# Patient Record
Sex: Male | Born: 1935 | Race: White | Hispanic: No | Marital: Married | Smoking: Never smoker
Health system: Southern US, Community
[De-identification: ages and names within clinical notes are randomized; demographics above are authoritative.]

## PROBLEM LIST (undated history)

## (undated) DIAGNOSIS — I10 Essential (primary) hypertension: Secondary | ICD-10-CM

## (undated) DIAGNOSIS — I509 Heart failure, unspecified: Secondary | ICD-10-CM

## (undated) DIAGNOSIS — D649 Anemia, unspecified: Secondary | ICD-10-CM

## (undated) DIAGNOSIS — E119 Type 2 diabetes mellitus without complications: Secondary | ICD-10-CM

## (undated) DIAGNOSIS — J449 Chronic obstructive pulmonary disease, unspecified: Secondary | ICD-10-CM

## (undated) HISTORY — PX: CHOLECYSTECTOMY: SHX55

## (undated) HISTORY — PX: BACK SURGERY: SHX140

---

## 2003-12-23 ENCOUNTER — Other Ambulatory Visit: Payer: Self-pay

## 2003-12-24 ENCOUNTER — Other Ambulatory Visit: Payer: Self-pay

## 2003-12-25 ENCOUNTER — Other Ambulatory Visit: Payer: Self-pay

## 2004-04-08 ENCOUNTER — Other Ambulatory Visit: Payer: Self-pay

## 2005-12-01 ENCOUNTER — Emergency Department: Payer: Self-pay | Admitting: Emergency Medicine

## 2005-12-04 ENCOUNTER — Other Ambulatory Visit: Payer: Self-pay

## 2005-12-04 ENCOUNTER — Inpatient Hospital Stay: Payer: Self-pay | Admitting: Internal Medicine

## 2006-01-19 ENCOUNTER — Ambulatory Visit: Payer: Self-pay

## 2006-03-16 ENCOUNTER — Emergency Department: Payer: Self-pay | Admitting: Emergency Medicine

## 2006-03-16 ENCOUNTER — Other Ambulatory Visit: Payer: Self-pay

## 2006-04-02 ENCOUNTER — Ambulatory Visit: Payer: Self-pay | Admitting: Internal Medicine

## 2006-07-02 ENCOUNTER — Ambulatory Visit: Payer: Self-pay | Admitting: Unknown Physician Specialty

## 2008-01-28 ENCOUNTER — Ambulatory Visit: Payer: Self-pay | Admitting: Internal Medicine

## 2008-10-25 ENCOUNTER — Inpatient Hospital Stay: Payer: Self-pay | Admitting: Internal Medicine

## 2008-11-22 ENCOUNTER — Inpatient Hospital Stay: Payer: Self-pay | Admitting: Internal Medicine

## 2009-02-12 ENCOUNTER — Inpatient Hospital Stay: Payer: Self-pay | Admitting: Internal Medicine

## 2009-11-15 ENCOUNTER — Ambulatory Visit: Payer: Self-pay | Admitting: Internal Medicine

## 2009-12-11 ENCOUNTER — Ambulatory Visit: Payer: Self-pay | Admitting: Internal Medicine

## 2011-05-18 ENCOUNTER — Emergency Department: Payer: Self-pay | Admitting: Emergency Medicine

## 2011-07-25 ENCOUNTER — Ambulatory Visit: Payer: Self-pay | Admitting: Urology

## 2011-07-25 DIAGNOSIS — I2510 Atherosclerotic heart disease of native coronary artery without angina pectoris: Secondary | ICD-10-CM

## 2011-08-09 ENCOUNTER — Ambulatory Visit: Payer: Self-pay | Admitting: Urology

## 2011-08-11 LAB — PATHOLOGY REPORT

## 2012-01-12 ENCOUNTER — Ambulatory Visit: Payer: Self-pay | Admitting: Internal Medicine

## 2012-01-12 LAB — CBC CANCER CENTER
Basophil: 1 %
Comment - H1-Com3: NORMAL
HCT: 28.2 % — ABNORMAL LOW (ref 40.0–52.0)
HGB: 9 g/dL — ABNORMAL LOW (ref 13.0–18.0)
Lymphocytes: 19 %
MCH: 24.1 pg — ABNORMAL LOW (ref 26.0–34.0)
MCV: 75 fL — ABNORMAL LOW (ref 80–100)
Monocytes: 6 %
RDW: 19 % — ABNORMAL HIGH (ref 11.5–14.5)
WBC: 4.4 x10 3/mm (ref 3.8–10.6)

## 2012-01-12 LAB — RETICULOCYTES: Absolute Retic Count: 0.052 10*6/uL (ref 0.024–0.084)

## 2012-01-12 LAB — IRON AND TIBC
Iron Saturation: 7 %
Iron: 35 ug/dL — ABNORMAL LOW (ref 65–175)
Unbound Iron-Bind.Cap.: 454 ug/dL

## 2012-01-12 LAB — PROTIME-INR: Prothrombin Time: 13.7 secs (ref 11.5–14.7)

## 2012-01-12 LAB — FOLATE: Folic Acid: 48.5 ng/mL (ref 3.1–100.0)

## 2012-01-12 LAB — FIBRINOGEN: Fibrinogen: 353 mg/dL (ref 210–470)

## 2012-01-15 LAB — URINE IEP, RANDOM

## 2012-01-26 LAB — OCCULT BLOOD X 1 CARD TO LAB, STOOL
Occult Blood, Feces: NEGATIVE
Occult Blood, Feces: NEGATIVE

## 2012-02-09 ENCOUNTER — Ambulatory Visit: Payer: Self-pay | Admitting: Internal Medicine

## 2012-03-22 ENCOUNTER — Ambulatory Visit: Payer: Self-pay | Admitting: Internal Medicine

## 2012-03-22 LAB — CBC CANCER CENTER
Basophil #: 0 x10 3/mm (ref 0.0–0.1)
Basophil %: 0.4 %
HCT: 28 % — ABNORMAL LOW (ref 40.0–52.0)
HGB: 8.5 g/dL — ABNORMAL LOW (ref 13.0–18.0)
Lymphocyte %: 14.1 %
MCH: 22.9 pg — ABNORMAL LOW (ref 26.0–34.0)
MCHC: 30.4 g/dL — ABNORMAL LOW (ref 32.0–36.0)
MCV: 76 fL — ABNORMAL LOW (ref 80–100)
Monocyte #: 0.6 x10 3/mm (ref 0.2–1.0)
Monocyte %: 13.9 %
Neutrophil %: 70.5 %
RBC: 3.7 10*6/uL — ABNORMAL LOW (ref 4.40–5.90)
RDW: 19.4 % — ABNORMAL HIGH (ref 11.5–14.5)
WBC: 4.4 x10 3/mm (ref 3.8–10.6)

## 2012-03-22 LAB — IRON AND TIBC
Iron Saturation: 11 %
Unbound Iron-Bind.Cap.: 410 ug/dL

## 2012-03-22 LAB — FERRITIN: Ferritin (ARMC): 10 ng/mL (ref 8–388)

## 2012-04-10 ENCOUNTER — Ambulatory Visit: Payer: Self-pay | Admitting: Internal Medicine

## 2012-05-11 ENCOUNTER — Ambulatory Visit: Payer: Self-pay | Admitting: Internal Medicine

## 2012-05-15 ENCOUNTER — Encounter: Payer: Self-pay | Admitting: Internal Medicine

## 2012-06-10 ENCOUNTER — Encounter: Payer: Self-pay | Admitting: Internal Medicine

## 2012-06-25 ENCOUNTER — Ambulatory Visit: Payer: Self-pay | Admitting: Internal Medicine

## 2012-06-25 LAB — CBC CANCER CENTER
Basophil #: 0 x10 3/mm (ref 0.0–0.1)
Eosinophil #: 0.1 x10 3/mm (ref 0.0–0.7)
Eosinophil %: 1.3 %
HCT: 34.8 % — ABNORMAL LOW (ref 40.0–52.0)
Lymphocyte %: 15.3 %
MCHC: 31.3 g/dL — ABNORMAL LOW (ref 32.0–36.0)
Monocyte %: 10.9 %
Neutrophil #: 3.1 x10 3/mm (ref 1.4–6.5)
Neutrophil %: 72.1 %
Platelet: 83 x10 3/mm — ABNORMAL LOW (ref 150–440)
RDW: 20.4 % — ABNORMAL HIGH (ref 11.5–14.5)
WBC: 4.2 x10 3/mm (ref 3.8–10.6)

## 2012-06-25 LAB — IRON AND TIBC
Iron Saturation: 4 %
Iron: 15 ug/dL — ABNORMAL LOW (ref 65–175)
Unbound Iron-Bind.Cap.: 345 ug/dL

## 2012-06-25 LAB — FERRITIN: Ferritin (ARMC): 57 ng/mL (ref 8–388)

## 2012-07-11 ENCOUNTER — Ambulatory Visit: Payer: Self-pay | Admitting: Internal Medicine

## 2012-07-25 ENCOUNTER — Ambulatory Visit: Payer: Self-pay | Admitting: Unknown Physician Specialty

## 2012-09-18 ENCOUNTER — Ambulatory Visit: Payer: Self-pay | Admitting: Internal Medicine

## 2012-09-18 LAB — CBC CANCER CENTER
Basophil #: 0 x10 3/mm (ref 0.0–0.1)
Basophil %: 0.8 %
Eosinophil #: 0.1 x10 3/mm (ref 0.0–0.7)
Eosinophil %: 1.7 %
HCT: 34.6 % — ABNORMAL LOW (ref 40.0–52.0)
HGB: 10.9 g/dL — ABNORMAL LOW (ref 13.0–18.0)
Lymphocyte #: 0.7 x10 3/mm — ABNORMAL LOW (ref 1.0–3.6)
Lymphocyte %: 15.2 %
MCV: 91 fL (ref 80–100)
Monocyte #: 0.4 x10 3/mm (ref 0.2–1.0)
Platelet: 78 x10 3/mm — ABNORMAL LOW (ref 150–440)
RBC: 3.82 10*6/uL — ABNORMAL LOW (ref 4.40–5.90)
WBC: 4.3 x10 3/mm (ref 3.8–10.6)

## 2012-09-18 LAB — FERRITIN: Ferritin (ARMC): 46 ng/mL (ref 8–388)

## 2012-09-18 LAB — IRON AND TIBC
Iron Bind.Cap.(Total): 375 ug/dL (ref 250–450)
Unbound Iron-Bind.Cap.: 321 ug/dL

## 2012-09-24 LAB — CBC CANCER CENTER
Bands: 1 %
Eosinophil: 1 %
Lymphocytes: 18 %
MCH: 28.6 pg (ref 26.0–34.0)
MCV: 91 fL (ref 80–100)
Platelet: 84 x10 3/mm — ABNORMAL LOW (ref 150–440)
RDW: 16.9 % — ABNORMAL HIGH (ref 11.5–14.5)
WBC: 5.7 x10 3/mm (ref 3.8–10.6)

## 2012-09-24 LAB — RETICULOCYTES
Absolute Retic Count: 0.0697 10*6/uL (ref 0.031–0.129)
Reticulocyte: 1.76 % (ref 0.7–2.5)

## 2012-10-11 ENCOUNTER — Ambulatory Visit: Payer: Self-pay | Admitting: Internal Medicine

## 2012-12-10 ENCOUNTER — Ambulatory Visit: Payer: Self-pay | Admitting: Internal Medicine

## 2012-12-10 LAB — FERRITIN: Ferritin (ARMC): 46 ng/mL (ref 8–388)

## 2012-12-10 LAB — CBC CANCER CENTER
Basophil #: 0 x10 3/mm (ref 0.0–0.1)
Eosinophil #: 0.1 x10 3/mm (ref 0.0–0.7)
Eosinophil %: 1.7 %
HCT: 37.3 % — ABNORMAL LOW (ref 40.0–52.0)
HGB: 12.2 g/dL — ABNORMAL LOW (ref 13.0–18.0)
Lymphocyte #: 0.9 x10 3/mm — ABNORMAL LOW (ref 1.0–3.6)
Lymphocyte %: 15.5 %
MCH: 29 pg (ref 26.0–34.0)
Monocyte #: 0.6 x10 3/mm (ref 0.2–1.0)
Neutrophil #: 4 x10 3/mm (ref 1.4–6.5)
Neutrophil %: 71.4 %
RBC: 4.22 10*6/uL — ABNORMAL LOW (ref 4.40–5.90)
WBC: 5.6 x10 3/mm (ref 3.8–10.6)

## 2012-12-10 LAB — IRON AND TIBC
Iron Saturation: 15 %
Unbound Iron-Bind.Cap.: 321 ug/dL

## 2012-12-11 ENCOUNTER — Ambulatory Visit: Payer: Self-pay | Admitting: Internal Medicine

## 2013-03-03 ENCOUNTER — Ambulatory Visit: Payer: Self-pay | Admitting: Internal Medicine

## 2013-03-04 LAB — CBC CANCER CENTER
Basophil #: 0 x10 3/mm (ref 0.0–0.1)
Basophil %: 0.3 %
Eosinophil #: 0.1 x10 3/mm (ref 0.0–0.7)
Eosinophil %: 1.1 %
HCT: 34.1 % — ABNORMAL LOW (ref 40.0–52.0)
HGB: 11.3 g/dL — ABNORMAL LOW (ref 13.0–18.0)
Lymphocyte #: 0.6 x10 3/mm — ABNORMAL LOW (ref 1.0–3.6)
MCHC: 33.1 g/dL (ref 32.0–36.0)
MCV: 87 fL (ref 80–100)
Neutrophil #: 3.9 x10 3/mm (ref 1.4–6.5)
Neutrophil %: 77.8 %
Platelet: 79 x10 3/mm — ABNORMAL LOW (ref 150–440)
RBC: 3.93 10*6/uL — ABNORMAL LOW (ref 4.40–5.90)
RDW: 15.8 % — ABNORMAL HIGH (ref 11.5–14.5)

## 2013-03-04 LAB — IRON AND TIBC
Iron Bind.Cap.(Total): 389 ug/dL (ref 250–450)
Iron Saturation: 13 %
Iron: 52 ug/dL — ABNORMAL LOW (ref 65–175)
Unbound Iron-Bind.Cap.: 337 ug/dL

## 2013-03-04 LAB — FERRITIN: Ferritin (ARMC): 24 ng/mL (ref 8–388)

## 2013-03-11 ENCOUNTER — Ambulatory Visit: Payer: Self-pay | Admitting: Internal Medicine

## 2013-04-10 ENCOUNTER — Ambulatory Visit: Payer: Self-pay | Admitting: Internal Medicine

## 2013-05-23 ENCOUNTER — Ambulatory Visit: Payer: Self-pay | Admitting: Internal Medicine

## 2013-05-27 LAB — CBC CANCER CENTER
Basophil %: 0.5 %
Eosinophil #: 0.1 x10 3/mm (ref 0.0–0.7)
Eosinophil %: 1.2 %
HGB: 11.5 g/dL — ABNORMAL LOW (ref 13.0–18.0)
MCH: 29.5 pg (ref 26.0–34.0)
MCHC: 33.5 g/dL (ref 32.0–36.0)
MCV: 88 fL (ref 80–100)
Monocyte #: 0.4 x10 3/mm (ref 0.2–1.0)
Neutrophil #: 3.8 x10 3/mm (ref 1.4–6.5)
Neutrophil %: 74.5 %
Platelet: 85 x10 3/mm — ABNORMAL LOW (ref 150–440)
RDW: 16.7 % — ABNORMAL HIGH (ref 11.5–14.5)
WBC: 5 x10 3/mm (ref 3.8–10.6)

## 2013-06-10 ENCOUNTER — Ambulatory Visit: Payer: Self-pay | Admitting: Internal Medicine

## 2013-08-19 ENCOUNTER — Ambulatory Visit: Payer: Self-pay | Admitting: Internal Medicine

## 2013-08-19 LAB — IRON AND TIBC
Iron Bind.Cap.(Total): 412 ug/dL (ref 250–450)
Iron Saturation: 11 %
Iron: 45 ug/dL — ABNORMAL LOW (ref 65–175)
Unbound Iron-Bind.Cap.: 367 ug/dL

## 2013-08-19 LAB — CBC CANCER CENTER
Basophil #: 0 x10 3/mm (ref 0.0–0.1)
Basophil %: 0.3 %
Eosinophil #: 0.1 x10 3/mm (ref 0.0–0.7)
Eosinophil %: 1.2 %
HCT: 37 % — ABNORMAL LOW (ref 40.0–52.0)
HGB: 12.1 g/dL — ABNORMAL LOW (ref 13.0–18.0)
Lymphocyte %: 13.2 %
Monocyte #: 0.4 x10 3/mm (ref 0.2–1.0)
Neutrophil %: 77.7 %
Platelet: 91 x10 3/mm — ABNORMAL LOW (ref 150–440)
RBC: 4.24 10*6/uL — ABNORMAL LOW (ref 4.40–5.90)
RDW: 16.4 % — ABNORMAL HIGH (ref 11.5–14.5)

## 2013-08-19 LAB — FERRITIN: Ferritin (ARMC): 24 ng/mL (ref 8–388)

## 2013-09-10 ENCOUNTER — Ambulatory Visit: Payer: Self-pay | Admitting: Internal Medicine

## 2013-10-24 ENCOUNTER — Ambulatory Visit: Payer: Self-pay | Admitting: Unknown Physician Specialty

## 2013-10-27 LAB — PATHOLOGY REPORT

## 2013-11-11 ENCOUNTER — Ambulatory Visit: Payer: Self-pay | Admitting: Internal Medicine

## 2013-11-11 LAB — CBC CANCER CENTER
Basophil #: 0 x10 3/mm (ref 0.0–0.1)
Basophil %: 0.6 %
HCT: 36.4 % — ABNORMAL LOW (ref 40.0–52.0)
HGB: 11.7 g/dL — ABNORMAL LOW (ref 13.0–18.0)
Lymphocyte %: 13.6 %
MCH: 28.4 pg (ref 26.0–34.0)
MCV: 89 fL (ref 80–100)
Neutrophil #: 3.5 x10 3/mm (ref 1.4–6.5)
Neutrophil %: 75.8 %
RDW: 16.7 % — ABNORMAL HIGH (ref 11.5–14.5)
WBC: 4.6 x10 3/mm (ref 3.8–10.6)

## 2013-11-11 LAB — IRON AND TIBC
Iron Bind.Cap.(Total): 349 ug/dL (ref 250–450)
Unbound Iron-Bind.Cap.: 304 ug/dL

## 2013-12-11 ENCOUNTER — Ambulatory Visit: Payer: Self-pay | Admitting: Internal Medicine

## 2014-02-10 ENCOUNTER — Ambulatory Visit: Payer: Self-pay | Admitting: Internal Medicine

## 2014-02-10 LAB — CBC CANCER CENTER
BASOS ABS: 0 x10 3/mm (ref 0.0–0.1)
BASOS PCT: 0.6 %
EOS ABS: 0.1 x10 3/mm (ref 0.0–0.7)
Eosinophil %: 1.1 %
HCT: 36.2 % — ABNORMAL LOW (ref 40.0–52.0)
HGB: 11.5 g/dL — ABNORMAL LOW (ref 13.0–18.0)
LYMPHS ABS: 0.7 x10 3/mm — AB (ref 1.0–3.6)
Lymphocyte %: 14.8 %
MCH: 29.5 pg (ref 26.0–34.0)
MCHC: 31.9 g/dL — ABNORMAL LOW (ref 32.0–36.0)
MCV: 92 fL (ref 80–100)
MONO ABS: 0.4 x10 3/mm (ref 0.2–1.0)
Monocyte %: 9 %
NEUTROS PCT: 74.5 %
Neutrophil #: 3.5 x10 3/mm (ref 1.4–6.5)
Platelet: 82 x10 3/mm — ABNORMAL LOW (ref 150–440)
RBC: 3.92 10*6/uL — ABNORMAL LOW (ref 4.40–5.90)
RDW: 16.4 % — ABNORMAL HIGH (ref 11.5–14.5)
WBC: 4.7 x10 3/mm (ref 3.8–10.6)

## 2014-02-10 LAB — IRON AND TIBC
Iron Bind.Cap.(Total): 308 ug/dL (ref 250–450)
Iron Saturation: 13 %
Iron: 41 ug/dL — ABNORMAL LOW (ref 65–175)
Unbound Iron-Bind.Cap.: 267 ug/dL

## 2014-02-10 LAB — FERRITIN: Ferritin (ARMC): 130 ng/mL (ref 8–388)

## 2014-03-11 ENCOUNTER — Ambulatory Visit: Payer: Self-pay | Admitting: Internal Medicine

## 2014-05-05 ENCOUNTER — Observation Stay: Payer: Self-pay | Admitting: Internal Medicine

## 2014-05-05 LAB — CBC WITH DIFFERENTIAL/PLATELET
BASOS PCT: 0.4 %
BASOS PCT: 0.3 %
Basophil #: 0 10*3/uL (ref 0.0–0.1)
Basophil #: 0 10*3/uL (ref 0.0–0.1)
EOS ABS: 0 10*3/uL (ref 0.0–0.7)
Eosinophil #: 0 10*3/uL (ref 0.0–0.7)
Eosinophil %: 0.8 %
Eosinophil %: 0.8 %
HCT: 36.5 % — ABNORMAL LOW (ref 40.0–52.0)
HCT: 37.2 % — ABNORMAL LOW (ref 40.0–52.0)
HGB: 11.9 g/dL — ABNORMAL LOW (ref 13.0–18.0)
HGB: 12.1 g/dL — ABNORMAL LOW (ref 13.0–18.0)
LYMPHS ABS: 0.5 10*3/uL — AB (ref 1.0–3.6)
LYMPHS PCT: 9.6 %
Lymphocyte #: 0.5 10*3/uL — ABNORMAL LOW (ref 1.0–3.6)
Lymphocyte %: 12.8 %
MCH: 29.8 pg (ref 26.0–34.0)
MCH: 30 pg (ref 26.0–34.0)
MCHC: 32.5 g/dL (ref 32.0–36.0)
MCHC: 32.5 g/dL (ref 32.0–36.0)
MCV: 92 fL (ref 80–100)
MCV: 92 fL (ref 80–100)
MONO ABS: 0.4 x10 3/mm (ref 0.2–1.0)
MONO ABS: 0.4 x10 3/mm (ref 0.2–1.0)
MONOS PCT: 9.6 %
Monocyte %: 8.1 %
NEUTROS PCT: 81.1 %
Neutrophil #: 3 10*3/uL (ref 1.4–6.5)
Neutrophil #: 4.2 10*3/uL (ref 1.4–6.5)
Neutrophil %: 76.5 %
Platelet: 107 10*3/uL — ABNORMAL LOW (ref 150–440)
Platelet: 67 10*3/uL — ABNORMAL LOW (ref 150–440)
RBC: 3.99 10*6/uL — ABNORMAL LOW (ref 4.40–5.90)
RBC: 4.03 10*6/uL — ABNORMAL LOW (ref 4.40–5.90)
RDW: 14.6 % — ABNORMAL HIGH (ref 11.5–14.5)
RDW: 15.2 % — ABNORMAL HIGH (ref 11.5–14.5)
WBC: 5.2 10*3/uL (ref 3.8–10.6)
WBC: 4 10*3/uL (ref 3.8–10.6)

## 2014-05-05 LAB — BASIC METABOLIC PANEL
Anion Gap: 10 (ref 7–16)
BUN: 35 mg/dL — ABNORMAL HIGH (ref 7–18)
CO2: 21 mmol/L (ref 21–32)
Calcium, Total: 9.1 mg/dL (ref 8.5–10.1)
Chloride: 96 mmol/L — ABNORMAL LOW (ref 98–107)
Creatinine: 1.58 mg/dL — ABNORMAL HIGH (ref 0.60–1.30)
EGFR (African American): 47 — ABNORMAL LOW
EGFR (Non-African Amer.): 40 — ABNORMAL LOW
GLUCOSE: 156 mg/dL — AB (ref 65–99)
OSMOLALITY: 266 (ref 275–301)
Potassium: 5.1 mmol/L (ref 3.5–5.1)
SODIUM: 127 mmol/L — AB (ref 136–145)

## 2014-05-05 LAB — URINALYSIS, COMPLETE
BLOOD: NEGATIVE
Bacteria: NONE SEEN
Bilirubin,UR: NEGATIVE
Glucose,UR: NEGATIVE mg/dL (ref 0–75)
Ketone: NEGATIVE
LEUKOCYTE ESTERASE: NEGATIVE
Nitrite: NEGATIVE
Ph: 7 (ref 4.5–8.0)
Protein: 100
Specific Gravity: 1.011 (ref 1.003–1.030)
Squamous Epithelial: NONE SEEN
WBC UR: NONE SEEN /HPF (ref 0–5)

## 2014-05-05 LAB — CK-MB: CK-MB: 2.5 ng/mL (ref 0.5–3.6)

## 2014-05-05 LAB — TROPONIN I: Troponin-I: <0.02 ng/mL

## 2014-05-06 DIAGNOSIS — R079 Chest pain, unspecified: Secondary | ICD-10-CM

## 2014-05-06 LAB — LIPID PANEL
Cholesterol: 110 mg/dL (ref 0–200)
HDL Cholesterol: 43 mg/dL (ref 40–60)
LDL CHOLESTEROL, CALC: 50 mg/dL (ref 0–100)
Triglycerides: 87 mg/dL (ref 0–200)
VLDL CHOLESTEROL, CALC: 17 mg/dL (ref 5–40)

## 2014-05-06 LAB — BASIC METABOLIC PANEL
ANION GAP: 7 (ref 7–16)
BUN: 30 mg/dL — AB (ref 7–18)
CREATININE: 1.6 mg/dL — AB (ref 0.60–1.30)
Calcium, Total: 8.9 mg/dL (ref 8.5–10.1)
Chloride: 98 mmol/L (ref 98–107)
Co2: 28 mmol/L (ref 21–32)
GFR CALC AF AMER: 46 — AB
GFR CALC NON AF AMER: 40 — AB
Glucose: 112 mg/dL — ABNORMAL HIGH (ref 65–99)
OSMOLALITY: 273 (ref 275–301)
Potassium: 3.5 mmol/L (ref 3.5–5.1)
Sodium: 133 mmol/L — ABNORMAL LOW (ref 136–145)

## 2014-05-06 LAB — CBC WITH DIFFERENTIAL/PLATELET
BASOS ABS: 0 10*3/uL (ref 0.0–0.1)
BASOS PCT: 0.3 %
EOS ABS: 0.1 10*3/uL (ref 0.0–0.7)
Eosinophil %: 1.4 %
HCT: 37 % — ABNORMAL LOW (ref 40.0–52.0)
HGB: 12.1 g/dL — ABNORMAL LOW (ref 13.0–18.0)
Lymphocyte #: 0.7 10*3/uL — ABNORMAL LOW (ref 1.0–3.6)
Lymphocyte %: 18.9 %
MCH: 30.2 pg (ref 26.0–34.0)
MCHC: 32.8 g/dL (ref 32.0–36.0)
MCV: 92 fL (ref 80–100)
MONOS PCT: 10.4 %
Monocyte #: 0.4 x10 3/mm (ref 0.2–1.0)
Neutrophil #: 2.7 10*3/uL (ref 1.4–6.5)
Neutrophil %: 69 %
Platelet: 77 10*3/uL — ABNORMAL LOW (ref 150–440)
RBC: 4.02 10*6/uL — AB (ref 4.40–5.90)
RDW: 15 % — ABNORMAL HIGH (ref 11.5–14.5)
WBC: 3.9 10*3/uL (ref 3.8–10.6)

## 2014-05-06 LAB — CK-MB
CK-MB: 2.2 ng/mL (ref 0.5–3.6)
CK-MB: 2.2 ng/mL (ref 0.5–3.6)

## 2014-05-06 LAB — TROPONIN I
Troponin-I: <0.02 ng/mL
Troponin-I: <0.02 ng/mL

## 2014-05-13 ENCOUNTER — Ambulatory Visit: Payer: Self-pay | Admitting: Unknown Physician Specialty

## 2014-05-14 ENCOUNTER — Emergency Department: Payer: Self-pay | Admitting: Emergency Medicine

## 2014-05-14 LAB — CBC
HCT: 34.7 % — ABNORMAL LOW (ref 40.0–52.0)
HGB: 11.3 g/dL — ABNORMAL LOW (ref 13.0–18.0)
MCH: 30.4 pg (ref 26.0–34.0)
MCHC: 32.6 g/dL (ref 32.0–36.0)
MCV: 93 fL (ref 80–100)
PLATELETS: 73 10*3/uL — AB (ref 150–440)
RBC: 3.73 10*6/uL — ABNORMAL LOW (ref 4.40–5.90)
RDW: 15 % — ABNORMAL HIGH (ref 11.5–14.5)
WBC: 4.2 10*3/uL (ref 3.8–10.6)

## 2014-05-14 LAB — BASIC METABOLIC PANEL
Anion Gap: 4 — ABNORMAL LOW (ref 7–16)
BUN: 30 mg/dL — AB (ref 7–18)
CALCIUM: 8.6 mg/dL (ref 8.5–10.1)
CREATININE: 1.61 mg/dL — AB (ref 0.60–1.30)
Chloride: 105 mmol/L (ref 98–107)
Co2: 25 mmol/L (ref 21–32)
GFR CALC AF AMER: 45 — AB
GFR CALC NON AF AMER: 39 — AB
GLUCOSE: 79 mg/dL (ref 65–99)
OSMOLALITY: 273 (ref 275–301)
POTASSIUM: 4.1 mmol/L (ref 3.5–5.1)
Sodium: 134 mmol/L — ABNORMAL LOW (ref 136–145)

## 2014-05-14 LAB — TROPONIN I: Troponin-I: <0.02 ng/mL

## 2014-05-14 LAB — PRO B NATRIURETIC PEPTIDE: B-TYPE NATIURETIC PEPTID: 2049 pg/mL — AB (ref 0–450)

## 2014-10-07 LAB — CBC
HCT: 36.6 % — AB (ref 40.0–52.0)
HGB: 11.5 g/dL — ABNORMAL LOW (ref 13.0–18.0)
MCH: 27.6 pg (ref 26.0–34.0)
MCHC: 31.4 g/dL — AB (ref 32.0–36.0)
MCV: 88 fL (ref 80–100)
Platelet: 80 10*3/uL — ABNORMAL LOW (ref 150–440)
RBC: 4.16 10*6/uL — ABNORMAL LOW (ref 4.40–5.90)
RDW: 16.9 % — ABNORMAL HIGH (ref 11.5–14.5)
WBC: 4.2 10*3/uL (ref 3.8–10.6)

## 2014-10-07 LAB — BASIC METABOLIC PANEL
ANION GAP: 7 (ref 7–16)
BUN: 25 mg/dL — AB (ref 7–18)
CREATININE: 1.93 mg/dL — AB (ref 0.60–1.30)
Calcium, Total: 8.7 mg/dL (ref 8.5–10.1)
Chloride: 101 mmol/L (ref 98–107)
Co2: 31 mmol/L (ref 21–32)
EGFR (African American): 43 — ABNORMAL LOW
GFR CALC NON AF AMER: 36 — AB
Glucose: 210 mg/dL — ABNORMAL HIGH (ref 65–99)
Osmolality: 288 (ref 275–301)
Potassium: 4.1 mmol/L (ref 3.5–5.1)
Sodium: 139 mmol/L (ref 136–145)

## 2014-10-07 LAB — TROPONIN I

## 2014-10-08 ENCOUNTER — Observation Stay: Payer: Self-pay | Admitting: Internal Medicine

## 2014-10-08 LAB — TROPONIN I
Troponin-I: <0.02 ng/mL
Troponin-I: 0.03 ng/mL

## 2014-10-08 LAB — PRO B NATRIURETIC PEPTIDE: B-TYPE NATIURETIC PEPTID: 2397 pg/mL — AB (ref 0–450)

## 2014-10-08 LAB — POTASSIUM: Potassium: 3.7 mmol/L (ref 3.5–5.1)

## 2014-10-09 LAB — CBC WITH DIFFERENTIAL/PLATELET
BASOS ABS: 0 10*3/uL (ref 0.0–0.1)
Basophil %: 0.3 %
EOS PCT: 1.4 %
Eosinophil #: 0.1 10*3/uL (ref 0.0–0.7)
HCT: 34.1 % — AB (ref 40.0–52.0)
HGB: 11.1 g/dL — ABNORMAL LOW (ref 13.0–18.0)
LYMPHS ABS: 0.6 10*3/uL — AB (ref 1.0–3.6)
LYMPHS PCT: 14.1 %
MCH: 28.5 pg (ref 26.0–34.0)
MCHC: 32.5 g/dL (ref 32.0–36.0)
MCV: 88 fL (ref 80–100)
MONOS PCT: 8.7 %
Monocyte #: 0.4 x10 3/mm (ref 0.2–1.0)
NEUTROS ABS: 3.3 10*3/uL (ref 1.4–6.5)
Neutrophil %: 75.5 %
PLATELETS: 75 10*3/uL — AB (ref 150–440)
RBC: 3.88 10*6/uL — ABNORMAL LOW (ref 4.40–5.90)
RDW: 16.9 % — ABNORMAL HIGH (ref 11.5–14.5)
WBC: 4.4 10*3/uL (ref 3.8–10.6)

## 2014-10-09 LAB — BASIC METABOLIC PANEL
ANION GAP: 6 — AB (ref 7–16)
BUN: 28 mg/dL — ABNORMAL HIGH (ref 7–18)
Calcium, Total: 8.5 mg/dL (ref 8.5–10.1)
Chloride: 101 mmol/L (ref 98–107)
Co2: 33 mmol/L — ABNORMAL HIGH (ref 21–32)
Creatinine: 1.92 mg/dL — ABNORMAL HIGH (ref 0.60–1.30)
EGFR (Non-African Amer.): 36 — ABNORMAL LOW
GFR CALC AF AMER: 43 — AB
Glucose: 96 mg/dL (ref 65–99)
Osmolality: 285 (ref 275–301)
Potassium: 3.6 mmol/L (ref 3.5–5.1)
Sodium: 140 mmol/L (ref 136–145)

## 2015-02-26 ENCOUNTER — Ambulatory Visit
Admit: 2015-02-26 | Disposition: A | Discharge status: Home / Self Care | Payer: Self-pay | Attending: Internal Medicine | Admitting: Internal Medicine

## 2015-03-12 ENCOUNTER — Ambulatory Visit
Admit: 2015-03-12 | Disposition: A | Discharge status: Home / Self Care | Payer: Self-pay | Attending: Internal Medicine | Admitting: Internal Medicine

## 2015-04-03 NOTE — H&P (Signed)
PATIENT NAME:  Michael Cisneros, Michael Cisneros MR#:  [id number] DATE OF BIRTH:  December 28, 1928  DATE OF ADMISSION:  10/08/2014  REFERRING PHYSICIAN:  Enedina Finner. Manson Passey, MD   PRIMARY CARE PHYSICIAN:  Kandyce Rud, MD  PRIMARY CARDIOLOGIST:  Arnoldo Hooker, MD  CHIEF COMPLAINT: 1.  Shortness of breath.  2.  Left-sided chest pain.   HISTORY OF PRESENT ILLNESS:  Mr. Harvel is a pleasant 81 year old Caucasian male with past medical history significant for hypertension, coronary artery disease status post stent, diabetes mellitus type 2 on insulin, CKD stage III, hyperlipidemia, history of gastrointestinal bleed in the past, history of chronic iron deficiency anemia and chronic thrombocytopenia, who presents to the Emergency Room with the complaints of shortness of breath. The patient states that he was in his usual state of health until a day before, which was on Tuesday night. When he went to bed, he developed shortness of breath, which was sudden in onset and associated with left-sided chest discomfort, which prevented him from going to bed that night. The patient did not seek any immediate medical attention but rested, following which the symptoms kind of resolved. The patient did not have any symptoms yesterday morning, but yesterday, which was Wednesday night, after supper, when he started going to bed, he again felt shortness of breath associated with some left-sided chest discomfort. Hence, he came to the Emergency Room for further evaluation. Denies any cough. No recent fever. No dizziness. No palpitations. No loss of consciousness. No nausea. No vomiting. No diarrhea. No abdominal pain. No GI bleed or melena. No focal weakness or numbness. No urinary symptoms such as dysuria, frequency, hematuria, or urgency.   In the Emergency Room, the patient was evaluated by the ED physician and was found to have elevated BNP and chest x-ray consistent with increased pulmonary vascular congestion and hence diagnosed to have acute  congestive heart failure. He was given IV furosemide 40 mg, following which he diuresed and started feeling better. Since then, the patient is resting comfortably and denies any complaints. At the current time, the patient is resting comfortably in the bed and denies any complaints such as shortness of breath, chest pain, or dizziness and states he is doing better.   PAST MEDICAL HISTORY: 1.  Hypertension.  2.  History of coronary artery disease status post stent.  3.  History of diabetes mellitus type 2 on insulin.  4.  History of hyperlipidemia.  5.  History of CKD stage III.  6.  History of GI bleed.  7.  Chronic iron deficiency anemia on iron pills.  8.  Chronic thrombocytopenia.   PAST SURGICAL HISTORY:  1.  Cervical laminectomy.  2.  Cholecystectomy.  3.  Cataract surgery.   ALLERGIES:  ELAVIL, AMITRIPTYLINE, NORVASC, BETA BLOCKER, ZOCOR, AND LEVAQUIN.  HOME MEDICATIONS: 1.  Align 4 mg capsule 1 capsule orally once a day.  2.  Aspirin 81 mg 1 tablet daily orally.  3.  Brimonidine ophthalmic 0.15% ophthalmic solution 1 drop to affected eye 2 times a day.  4.  Claritin 5 mg tablet chewable 1 tablet a day.  5.  Crestor 10 mg 1 tablet orally daily.  6.  Ferrex 150 mg oral capsule 1 capsule 2 times a day.  7.  Fish oil capsules 1200 mg oral capsule 1 capsule 3 times a day.  8.  Hydrochlorothiazide 25 mg 1 tablet daily in the morning.  9.  Lantus 28 units after breakfast and 10 units after supper.  10.  Lisinopril 10  mg 1 tablet orally daily.  11.  Multivitamin 1 tablet once a day.  12.  Nitroglycerin 0.4 mg sublingual tablet 1 tablet orally as needed.  13.  Omeprazole 20 mg tablet 1 tablet a day.  14.  Percogesic as needed for pain.   SOCIAL HISTORY:  He is married and lives with his wife. Denies any history of smoking, alcohol, or substance abuse. He is a retired person.   FAMILY HISTORY:  Significant for coronary artery disease and diabetes.    REVIEW OF  SYSTEMS: CONSTITUTIONAL:  Negative for fever, fatigue, generalized weakness, or abnormal weight gain or weight loss recently.  EYES:  Negative for blurred vision or double vision. No pain. No redness. No inflammation.  EARS, NOSE, AND THROAT:  Negative for tinnitus, ear pain, hearing loss, epistaxis, nasal discharge, or difficulty swallowing.  RESPIRATORY:  Negative for cough, wheezing, hemoptysis, dyspnea, or painful respirations.  CARDIOVASCULAR:  Positive for left-sided chest pain and shortness of breath as noted in the history of present illness. No pedal edema. No dyspnea on exertion. No palpitations. No syncope or loss of consciousness. GASTROINTESTINAL:  Negative for nausea, vomiting, diarrhea, or abdominal pain. No hematemesis. No rectal bleeding.  GENITOURINARY:  Negative for dysuria, hematuria, frequency, or urgency.  ENDOCRINE:  Negative for polyuria or polydipsia. No heat or cold intolerance.  HEMATOLOGIC AND ONCOLOGIC:  History of chronic iron deficiency anemia present, for which he takes iron tablets 2 a day. History of chronic thrombocytopenia present. No easy bruising and no bleeding at this time.   INTEGUMENTARY:  No acne. No skin rash or lesions.  MUSCULOSKELETAL:  Negative for any back pain, arthritis, or joint swelling or tenderness.  NEUROLOGICAL:  Negative for focal weakness or numbness. No history of CVA, TIA, or seizure disorder.  PSYCHIATRIC:  Negative for anxiety, insomnia, or depression.   PHYSICAL EXAMINATION: VITAL SIGNS:  Temperature 98.6 degrees Fahrenheit, pulse rate 103 on presentation to the Emergency Room, respirations 22 on presentation to the Emergency Room, blood pressure 171/74 initially, oxygen saturation 93% on room air. Current vital signs:  Pulse 81 per minute, respirations 16 per minute, blood pressure 140/66, oxygen saturation 95% on room air.   GENERAL:  Well developed, well nourished, pleasant, cooperative, alert and oriented, in no acute distress,  comfortably lying in the bed.  HEAD:  Atraumatic, normocephalic.  EYES:  Pupils are equally reactive to light and accommodation. No conjunctival pallor. No scleral icterus. Extraocular movements are intact.  NOSE: No nasal lesions. No discharge.  EARS:  No drainage. No external lesions.  ORAL CAVITY:  No mucosal lesions. No exudates. No masses.  NECK:  Supple. No JVD. No thyromegaly. No carotid bruit. Range of motion is normal.  RESPIRATORY:  Good respiratory effort. Not using accessory muscles of respiration. Bilateral vesicular breath sounds present, a few rales at both bases present. No rhonchi.  CARDIOVASCULAR:  S1 and S2, regular. No murmurs appreciated. Peripheral pulses at carotid, femoral, and pedal pulses are equal. Trace pedal edema.   GASTROINTESTINAL:  Abdomen is soft, obese, and nontender. No guarding. No rigidity. Bowel sounds present and equal in all 4 quadrants. No hepatosplenomegaly.  GENITOURINARY:  Deferred.  MUSCULOSKELETAL:  Range of motion is adequate. Strength and tone are equal bilaterally in both upper and lower extremities.  SKIN:  Inspection within normal limits. Well hydrated.  LYMPHATIC:  No cervical lymphadenopathy.  VASCULAR:  Good dorsalis pedis and posterior tibial pulses.  NEUROLOGICAL:  Alert, awake, and oriented x 3. Cranial nerves II through  XII are grossly intact. DTRs are 2+ and symmetrical bilaterally. No focal deficit. Motor strength is 5/5 in all 4 extremities.  PSYCHIATRIC:  Judgment and insight are adequate. Alert and oriented x 3. Memory and mood are within normal limits.   LABORATORY DATA:  BNP 2397, serum glucose 210, BUN 25, creatinine 1.93, sodium 139, potassium 4.1, chloride 101, bicarbonate 31, total calcium 8.7. Troponin less than 0.02. WBC 4.2, hemoglobin 11.5, hematocrit 36.6, platelet count 80,000, MCV 88.   IMAGING STUDIES:  Chest x-ray:  Heart size upper normal. Infrahilar interstitial and hazy airspace opacities may reflect pulmonary edema  or multifocal infection. Small pleural effusion suspected.   EKG:  Poor baseline. Accelerated junctional rhythm with premature supraventricular complexes with occasional PVCs present. No acute ST-T changes. Compared to the old EKG, no new changes.   ASSESSMENT AND PLAN:  This is an 81 year old Caucasian male with past medical history significant for hypertension, coronary artery disease status post stent, history of diabetes mellitus type 2 on insulin, hyperlipidemia, chronic kidney disease stage III, history of gastrointestinal bleed, history of chronic iron deficiency anemia and chronic thrombocytopenia, who presents with the complaints of acute onset of shortness of breath associated with left-sided chest pain.   1.  Shortness of breath secondary due to decompensated congestive heart failure, systolic versus diastolic. The patient received IV Lasix 40 mg in the Emergency Room, following which he diuresed, and he is feeling better now. Plan:  IV Lasix 40 mg b.i.d. Continue aspirin and statin. Cycle cardiac enzymes. Order echocardiogram to find out the ejection fraction. Follow BNP. 2.  Left-sided chest pain in a patient with a history of coronary artery disease status post stent. Rule out acute coronary syndrome. Troponin x 1 negative. EKG:  No new acute changes. Plan:  Telemetry monitoring. Continue aspirin, nitroglycerin p.r.n., and statin. No beta blocker because of history of ALLERGY TO BETA BLOCKER. No heparin because of thrombocytopenia.  3.  Hypertension, under reasonable control with home medications. Continue same.  4.  Diabetes mellitus type 2 on insulin. Continue Lantus at lower dose. Continue sliding scale insulin.   5.  Chronic kidney disease stage III. Creatinine mild elevation compared to previous level. The patient is stable. Monitor creatinine closely. Avoid nephrotoxic agents.  6.  Hyperlipidemia on Crestor. Continue same.  7.  Chronic iron deficiency anemia on iron supplements.  Hemoglobin and hematocrit are mildly low but stable. The patient is stable. Continue iron supplements and follow up CBC.  8.  Chronic thrombocytopenia, platelets mildly low at 80,000 and stable. Monitor. Avoid agents that cause thrombocytopenia.  9.  History of prior gastrointestinal bleed, stable. Continue proton pump inhibitor.  10.  Deep vein thrombosis prophylaxis with sequential compression devices.  11.  Gastrointestinal prophylaxis with proton pump inhibitor.   CODE STATUS:  Full code.   TIME SPENT:  55 minutes.    ____________________________ Crissie FiguresEdavally N. Ameliah Baskins, MD enr:nb D: 10/08/2014 03:53:58 ET T: 10/08/2014 04:44:03 ET JOB#: [id number]  cc: Crissie FiguresEdavally N. Orman Matsumura, MD, <Dictator> Kandyce RudMarcus Babaoff, MD Lamar BlinksBruce J. Kowalski, MD   Crissie FiguresEDAVALLY N Avanna Sowder MD ELECTRONICALLY SIGNED 11/06/2014 19:56

## 2015-04-03 NOTE — Consult Note (Signed)
Brief Consult Note: Diagnosis: Melena.  Known history of chronic IDA suspected secondary to known history of AVMs.  In review of prior endoscopic procedure results as well as current laboratory studies, feel patient has cirrhosis and prob secondary to fatty infiiltration.  Anemia. History of CAD.  Thrombocytopenia.   Consult note dictated.   Discussed with Attending MD.   Comments: Patient's presentation discussed with Dr. Dow AdolphMatthew Rein.  Will proceed with monitoring hemoglobin at this time.  Hemoglobin has remained stable thus far during admission.  No recommendation of endoscopic evaluation at this time.  Do recommend patient follow-up in our office after being discharged with Dr. Shelle Ironein to discuss new diagnosis of cirrhosis and medical management.  Needs to follow 2 gm sodium diet. Will follow.  Electronic Signatures: Rodman KeyHarrison, Jenene Kauffmann S (NP)  (Signed 27-May-15 15:17)  Authored: Brief Consult Note   Last Updated: 27-May-15 15:17 by Rodman KeyHarrison, Enolia Koepke S (NP)

## 2015-04-03 NOTE — H&P (Signed)
PATIENT NAME:  Michael BreslowWHITE, Michael Cisneros MR#:  [id number] DATE OF BIRTH:  December 21, 1928  DATE OF ADMISSION:  05/05/2014  REFERRING PHYSICIAN:  Dr. Ethelda ChickJacubowitz.  PRIMARY CARE PHYSICIAN:  Dr. Larwance SachsBabaoff.     CARDIOLOGY:  Dr. Darrold JunkerParaschos.   GASTROENTEROLOGY:  Dr. Mechele CollinElliott.   CHIEF COMPLAINT:  Weakness, chest pain.   HISTORY OF PRESENT ILLNESS:  An 81 year old gentleman with history of coronary artery disease, hypertension, hyperlipidemia, diabetes, presenting with weakness and chest pain.  Describes chest pain, acute onset at rest, left chest pressure in quality, 5 to 6 out of 10 in intensity, nonradiating, no worsening or relieving factors.  No associated symptoms.  However had been complaining of weakness, progressively worsened for the last 1 to 2 day duration, which is generalized to the point where he had some ambulatory dysfunction today, stating that he felt like he was going to fall over and suddenly sat on the couch which happened multiple times today.  He also denotes having dark stools which he attributed this to iron therapy.  He is however heme positive in the Emergency Department.  Currently no further complaints.   REVIEW OF SYSTEMS:  CONSTITUTIONAL:  Positive for fatigue, weakness.  Denies fevers, chills.  EYES:  Denies blurry vision, double vision, eye pain.  EARS, NOSE, THROAT:  Denies tinnitus, ear pain or hearing loss.  RESPIRATORY:  Denies cough, wheeze, shortness of breath.  CARDIOVASCULAR:  Positive for chest pain as described above.  Denies any palpitations or edema.  GASTROINTESTINAL:  Denies nausea, vomiting, diarrhea or abdominal pain.  Positive for melena.  GENITOURINARY:  Denies dysuria, hematuria.  ENDOCRINE:  Denies nocturia or thyroid problems. HEMATOLOGY AND LYMPHATIC:  Denies easy bruising, bleeding.  SKIN:  Denies rash or lesion.  MUSCULOSKELETAL:  Denies pain in neck, back, shoulder, knees, hips or arthritic symptoms.  NEUROLOGIC:  Denies paralysis, paresthesias.  PSYCHIATRIC:   Denies anxiety or depressive symptoms. Otherwise, full review of systems performed by me is negative.   PAST MEDICAL HISTORY:  Hyperlipidemia, CVA, hypertension, diabetes, insulin-requiring, coronary artery disease status post PCI and stenting.   SOCIAL HISTORY:  Denies any alcohol, tobacco or drug usage.   FAMILY HISTORY:  Positive for coronary artery disease as well as diabetes.   ALLERGIES:  INCLUDE AMITRIPTYLINE, BETA BLOCKERS, ELAVIL, LEVAQUIN, NORVASC AND ZOCOR.   HOME MEDICATIONS:  Include Claritin 5 mg by mouth daily, hydrochlorothiazide 25 mg by mouth daily,  iron 120 mg by mouth twice daily, Alphagan 0.1% ophthalmic solution one drop to each eye twice daily, Align probiotic 4 mg by mouth daily, Prilosec 20 mg by mouth daily, aspirin 81 mg by mouth daily, Crestor 10 mg by mouth daily, fish oil 1200 mg by mouth 3 times daily with meals, insulin 28 units in the morning, 10 units in the evening, lisinopril 5 mg by mouth daily, multivitamin 1 tablet daily, nitroglycerin 0.4 mg sublingual as needed for chest pain.   PHYSICAL EXAMINATION: VITAL SIGNS:  Temperature 97.9, heart rate 82, respirations 16, blood pressure 193/80, saturating 95% on room air.  Weight 89.8 kg, BMI 30.1.  GENERAL:  Well-nourished, well-developed, Caucasian gentleman, currently in no acute distress.  HEAD:  Normocephalic, atraumatic.  EYES:  Pupils equal, round, reactive to light.  Extraocular muscles intact.  No scleral icterus.  MOUTH:  Moist mucous membranes.  Dentition intact.  No abscess noted.   EAR, NOSE, THROAT:  Clear without exudates.  No external lesions.  NECK:  Supple.  No thyromegaly.  No nodules.  No JVD.  PULMONARY:  Clear to auscultation bilaterally without wheezes, rales, rhonchi.  No use of accessory muscles.  Good respiratory effort.  Chest nontender to palpation.  CARDIOVASCULAR:  S1, S2, regular rate and rhythm.  No murmurs, rubs, or gallops.  No edema.  Pedal pulses 2+ bilaterally.   GASTROINTESTINAL:  Soft, nontender, nondistended.  No masses.  Positive bowel sounds.  No hepatosplenomegaly.  Heme positive stools, one guaiac.  MUSCULOSKELETAL:  No swelling, clubbing, edema.  Range of motion full in all extremities. NEUROLOGIC:  Cranial nerves II through XII intact.  No gross focal neurological deficits.  Sensation intact.  Reflexes intact.  SKIN:  No ulcerations, lesions, rash, cyanosis.  Skin warm, dry.  Turgor intact.  PSYCHIATRIC:  Mood and affect within normal limits.  The patient alert, awake, oriented x 3.  Insight and judgment intact.   LABORATORY DATA:  EKG performed revealing normal sinus rhythm with minimal voltage criteria for LVH.  No ST or T wave abnormalities.  Sodium 127, potassium 5.1, chloride 96, bicarb 21, BUN 35, creatinine 1.58, glucose 156.  Troponin less than 0.02.  WBC 5.2, hemoglobin 12.1, platelets of 107.  Urinalysis negative for evidence of infection.  Chest x-ray performed revealing cardiomegaly without acute cardiopulmonary process.   ASSESSMENT AND PLAN:  An 81 year old gentleman presenting with weakness and chest pain.   1.  Chest pain.  Admit to telemetry under observational status.  Trend cardiac enzymes x 3.  If enzymes become positive we will consult [facility].  He follows with Dr. Darrold Junker.  He has received aspirin.  Continue statin therapy.  2.  Hypertensive urgency.  We will add hydralazine as needed 10 mg intravenous to keep blood pressure less than 180/100 in addition to his home medications.  3.  Melena.  He does take oral iron which may contribute to some dark stools, however he is heme positive on examination.  We will trend complete blood count q. 6 hours with transfusion threshold hemoglobin less than 7 to keep hemoglobin greater than 10 if chest pain returns.  We will consult gastroenterology.  He previously follows with Dr. Markham Jordan and had known colonic arteriovenous malformations.  4.  Hyponatremia with 244.9 mEq sodium deficit.   We will provide intravenous fluid hydration with normal saline 65 mL an hour.  Follow sodium levels.  5.  Diabetes, insulin sliding scale as well as q. 6 hour Accu-Cheks and continue Levemir; however, we will decrease his dose while he is in the hospital and follow trend and adjust accordingly.  6.  Venous thromboembolism prophylaxis with sequential compression devices.  7.  CODE STATUS:  THE PATIENT IS FULL CODE.   TIME SPENT:  45 minutes.    ____________________________ Cletis Athens. Perlita Forbush, MD dkh:ea Cisneros: 05/05/2014 22:28:00 ET T: 05/06/2014 00:01:40 ET JOB#: [id number]  cc: Cletis Athens. Banessa Mao, MD, <Dictator> Aimee Heldman Synetta Shadow MD ELECTRONICALLY SIGNED 05/06/2014 3:28

## 2015-04-03 NOTE — Discharge Summary (Signed)
Dates of Admission and Diagnosis:  Date of Admission 05-May-2014   Date of Discharge 06-May-2014   Admitting Diagnosis chest pain   Final Diagnosis 1. Dehydration 2. Chronic angina-Unchanged 3. Chronic iron deficiency anemia 4. Chronci thrombocytopenia 5. CAD 6. HTN    Chief Complaint/History of Present Illness CHIEF COMPLAINT:  Weakness, chest pain.   HISTORY OF PRESENT ILLNESS:  An 81 year old gentleman with history of coronary artery disease, hypertension, hyperlipidemia, diabetes, presenting with weakness and chest pain.  Describes chest pain, acute onset at rest, left chest pressure in quality, 5 to 6 out of 10 in intensity, nonradiating, no worsening or relieving factors.  No associated symptoms.  However had been complaining of weakness, progressively worsened for the last 1 to 2 day duration, which is generalized to the point where he had some ambulatory dysfunction today, stating that he felt like he was going to fall over and suddenly sat on the couch which happened multiple times today.  He also denotes having dark stools which he attributed this to iron therapy.  He is however heme positive in the Emergency Department.  Currently no further complaints.   Allergies:  elavil: Alt Ment Status, Hallucinations  Amitriptyline: Alt Ment Status, Hallucinations  Norvasc: Other  Beta Blockers: Other  Zocor: Unknown  Levaquin: Unknown  Cardiology:  27-May-15 07:25   Ventricular Rate 62  Atrial Rate 62  P-R Interval 250  QRS Duration 112  QT 436  QTc 442  R Axis 15  T Axis 4  ECG interpretation Sinus rhythm with 1st degree A-V block with occasional Premature ventricular complexes Otherwise normal ECG When compared with ECG of 05-May-2014 17:29, Premature ventricular complexes are now Present PR interval has increased Confirmed by Glenetta Hew ([id number]) on 05/06/2014 9:26:47 AM  Overreader: Glenetta Hew  Routine Chem:  27-May-15 03:46   Glucose, Serum  112  BUN  30   Creatinine (comp)  1.60  Sodium, Serum  133  Potassium, Serum 3.5  Chloride, Serum 98  CO2, Serum 28  Calcium (Total), Serum 8.9  Anion Gap 7  Osmolality (calc) 273  eGFR (African American)  46  eGFR (Non-African American)  40 (eGFR values <3m/min/1.73 m2 may be an indication of chronic kidney disease (CKD). Calculated eGFR is useful in patients with stable renal function. The eGFR calculation will not be reliable in acutely ill patients when serum creatinine is changing rapidly. It is not useful in  patients on dialysis. The eGFR calculation may not be applicable to patients at the low and high extremes of body sizes, pregnant women, and vegetarians.)  Cholesterol, Serum 110  Triglycerides, Serum 87  HDL (INHOUSE) 43  VLDL Cholesterol Calculated 17  LDL Cholesterol Calculated 50 (Result(s) reported on 06 May 2014 at 05:04AM.)  Cardiac:  27-May-15 03:46   CPK-MB, Serum 2.2 (Result(s) reported on 06 May 2014 at 04:33AM.)  Troponin I < 0.02 (0.00-0.05 0.05 ng/mL or less: NEGATIVE  Repeat testing in 3-6 hrs  if clinically indicated. >0.05 ng/mL: POTENTIAL  MYOCARDIAL INJURY. Repeat  testing in 3-6 hrs if  clinically indicated. NOTE: An increase or decrease  of 30% or more on serial  testing suggests a  clinically important change)  Routine Hem:  27-May-15 05:43   WBC (CBC) 3.9  RBC (CBC)  4.02  Hemoglobin (CBC)  12.1  Hematocrit (CBC)  37.0  Platelet Count (CBC)  77  MCV 92  MCH 30.2  MCHC 32.8  RDW  15.0  Neutrophil % 69.0  Lymphocyte % 18.9  Monocyte % 10.4  Eosinophil % 1.4  Basophil % 0.3  Neutrophil # 2.7  Lymphocyte #  0.7  Monocyte # 0.4  Eosinophil # 0.1  Basophil # 0.0 (Result(s) reported on 06 May 2014 at 06:09AM.)   Pertinent Past History:  Pertinent Past History PAST MEDICAL HISTORY:  Hyperlipidemia, CVA, hypertension, diabetes, insulin-requiring, coronary artery disease status post PCI and stenting.   Hospital Course:  Hospital Course *  Chronic angina- Unchanged Cardiac enzymes normal. EKG nothing acute  * Hem positive stools Has had chronic melena due to iron pills. Hb stable. Has AVMs on prior colonoscopy. Seen by GI and no endoscopy planned. Advised to return if any frank bleeding.  * Possible cirrhosis f/u with GI  * Chronic thrombocytopenia Stable  * Orthostatic hypotension Due to dehydration IVF. resolved.  Prior to d/c pt ambulated. Doing well.  S1, S2 Lungs CTA Abd-Soft, NT, BS present  Time spent on d/c 34 minutes   Condition on Discharge Fair   Code Status:  Code Status Full Code   DISCHARGE INSTRUCTIONS HOME MEDS:  Medication Reconciliation: Patient's Home Medications at Discharge:     Medication Instructions  omeprazole 20 mg oral enteric coated tablet  1 tab daily in AM   hydrochlorothiazide 25 mg oral tablet  1 tab(s) orally once a day (in the morning)   fish oil 1242m  1 cap oral TID with meals   lisinopril 10 mg oral tablet  0.5 tab(s) orally once a day (in the morning)   multivitamin  1 tab(s) orally once a day (in the morning)   aspir 81 oral enteric coated tablet  1 tab oral daily in AM   crestor 171m 1 tab oral daily at hs   ferrex-150 oral capsule  cap(s) orally 2 times a day   align 4 mg oral capsule  1 cap(s) orally once a day   insulin  1 dose(s) subcutaneous 2 times a day 28 units in am , 10 units in pm   alphagan p 0.1% ophthalmic solution  1 drop(s) to each affected eye 2 times a day   claritin 5 mg oral tablet, chewable  1 tab(s) orally every 24 hours   nitroglycerin 0.14m68m1 tab(s) orally , As Needed   percogesic    , As Needed - for Pain   miralax oral powder for reconstitution  17 gram(s) orally once a day. Hold if diarrhea     Physician's Instructions:  Diet Low Sodium  Carbohydrate Controlled (ADA) Diet   Activity Limitations As tolerated   Return to Work Not Applicable   Time frame for Follow Up Appointment 1-2 weeks  Dr. EllVira AgarOther Comments  Call Dr. EllVira Agar return to ER if any bright blood in stool   Electronic Signatures: Ranita Stjulien, SriLottie DawsonD)  (Signed [phone])  Authored: ADMISSION DATE AND DIAGNOSIS, CHIEF COMPLAINT/HPI, Allergies, PERTINENT LABS, PERLatahATIENT INSTRUCTIONS   Last Updated: 28-May-15 12:22 by SudAlba DestineD)

## 2015-04-03 NOTE — Discharge Summary (Signed)
PATIENT NAME:  Michael BreslowWHITE, Calen D MR#:  [id number] DATE OF BIRTH:  June 13, 1929  DATE OF ADMISSION:  10/08/2014 DATE OF DISCHARGE:  10/09/2014  DISCHARGE DIAGNOSES: Acute on chronic diastolic heart failure, acute on chronic renal failure, CKD stage III, type 2 diabetes mellitus with diabetic nephropathy and hypertension.   DISCHARGE MEDICATIONS: Omeprazole 20 mg daily, hydrochlorothiazide 25 mg p.o. daily, Ferrex 150 mg p.o. b.i.d., Align 4 mg p.o. daily, Claritin 5 mg p.o. every 24 hours, brimonidine eyedrops 0.15% into affected eye 2 times a day, aspirin 81 mg daily, Crestor 10 mg at bedtime, fish oil 1200 mg p.o. t.i.d., Lantus 28 units b.i.d., lisinopril 10 mg daily, nitroglycerin sublingual p.r.n. for chest pain.   DIET: Low-sodium, low-fat, ADA diet. Consistency regular.   CONSULTATIONS: Cardiology consult with Marcina MillardAlexander Paraschos, MD.    HOSPITAL COURSE: An 81 year old male patient with a history of hypertension, chronic diastolic heart failure admitted because of shortness of breath and chest pressure. The patient's EKG was unremarkable. Troponins were negative and the patient's BNP was elevated on admission to 2397. Chest x-ray showed pulmonary edema. The patient admitted to the hospitalist service for acute on chronic diastolic heart failure. Started on IV Lasix. The patient's electrocardiogram showed EF of 50 to 55% with diastolic filling defect pattern. The patient was seen by Dr. Darrold JunkerParaschos and he recommended to continue same treatment. The patient received IV Lasix, his symptoms improved. I advised him to continue his hydrochlorothiazide  that he is taking at home.   PHYSICAL EXAMINATION:   VITAL SIGNS:  The patient's O2 saturations at the time of discharge 93% on room air. The patient's discharge blood pressure 110/60   and heart rate 86. LUNGS: Clear.   CARDIOVASCULAR: S1, S2 regular.  EXTREMITIES: The patient has no pedal edema.   The patient was not given Lasix because of his chronic  kidney function secondary to CKD stage III secondary to diabetes. The patient advised to follow up with Dr. Darrold JunkerParaschos next week and continue his hydrochlorothiazide that he is taking at home. The patient's BUN and creatinine are, today BUN 28, creatinine 1.92. For diabetes he is advised to continue his Lantus and primary doctor is Dr. Larwance SachsBabaoff.   TIME SPENT ON DISCHARGE PREPARATION: More than 30 minutes.    ____________________________ Katha HammingSnehalatha Kalel Harty, MD sk:AT D: 10/09/2014 22:05:24 ET T: 10/10/2014 03:08:58 ET JOB#: [id number]  cc: Katha HammingSnehalatha Carrin Vannostrand, MD, <Dictator> Kandyce RudMarcus Babaoff, MD Marcina MillardAlexander Paraschos, MD Katha HammingSNEHALATHA Aletheia Tangredi MD ELECTRONICALLY SIGNED 11/01/2014 23:24

## 2015-04-03 NOTE — Consult Note (Signed)
PATIENT NAME:  Michael Cisneros, Michael Cisneros MR#:  [id number] DATE OF BIRTH:  12-Dec-1928  DATE OF CONSULTATION:  05/06/2014  ATTENDING PHYSICIAN:  Cletis Athens. Hower, MD CONSULTING PHYSICIAN:  Dow Adolph, MD; Rodman Key, NP  PRIMARY CARE PHYSICIAN:  Kandyce Rud, MD PRIMARY CARDIOLOGIST: Marcina Millard, MD PRIMARY GASTROENTEROLOGIST: Scot Jun, MD  REASON FOR CONSULTATION: Melena.   HISTORY OF PRESENT ILLNESS: Michael Cisneros is a very pleasant 81 year old Caucasian gentleman with significant medical history of coronary artery disease, hypertension, hyperlipidemia, and diabetes. The patient had presented to the Emergency Room yesterday for the complaint of weakness and chest pain. The patient states that he does intermittently experience chest pain, which he has been advised by Dr. Darrold Junker that this will probably occur. Suspect history of angina. He states that he has been experiencing weakness with it. He was advised his blood pressure was low when he presented to the Emergency Room. He does state that this happens with postural changes at times. No nausea. No vomiting. No abdominal pain. Known history of reflux, which according to the patient has been well controlled with taking of omeprazole 20 mg once a day. Normal bowel pattern can be every day, 2 times a day, or every other day. The patient has been experiencing black-colored stool, which is to be expected as he is on iron on a daily basis. He has been under the care of Dr. Sherrlyn Hock at [facility] cancer/hematology department since at least fall of last year. The patient is currently taking over-the-counter iron. He had endoscopic evaluation performed by Dr. Lynnae Prude on 10/24/2013, at which time with indication of iron deficiency anemia secondary to chronic blood loss, findings of one polyp in the transverse colon measuring 5 mm in size. Histology report revealed a tubular adenoma. Five large angiectases without bleeding were found in the  transverse colon, hepatic flexure, and ascending as well as cecum. Coagulation for tissue destruction using argon plasma was utilized. Large nonbleeding rectal varices were found as well as internal hemorrhoids. Upper endoscopy revealed evidence of grade 2 esophageal varices, portal hypertensive gastropathy, and normal examined duodenum. The patient denies hepatic history. No history of cirrhosis. He did have an abdominal ultrasound done 09/24/2012, which revealed evidence of hepatosplenomegaly. Noted increase in size from prior exam which was done in 2009. Findings of fatty infiltration of liver at that time.   The patient denies any evidence of bright red blood per rectum. Last bowel movement was 5:00 this morning. The patient has been able to tolerate a regular diet. He has no complaints at this time, states that he is feeling better. He has been the primary caretaker of his wife, who has been hospitalized twice recently. In fact, she is a patient myself as well as Dr. Lutricia Feil.   REVIEW OF SYSTEMS:    CONSTITUTIONAL: Significant for fatigue and weakness. No fever, no chills.  EYES: No blurred vision, double vision, eye pain.  EARS, NOSE, AND THROAT: No tinnitus, ear pain, hearing loss.  RESPIRATORY: No coughing, no wheezing, no shortness of breath.  CARDIOVASCULAR: See HPI.  GASTROINTESTINAL: See HPI.  GENITOURINARY: No dysuria. Known history of hematuria and has been under the care of Dr. Lonna Cobb in the past.  ENDOCRINE: No nocturia, thyroid problems.  HEMATOLOGIC AND LYMPHATIC: Denies significant easy bruising and bleeding.  SKIN: No lesions. No rashes.  MUSCULOSKELETAL: Denies any neuralgias or myalgias.  NEUROLOGIC: No history of CVA or TIA or paralysis.  PSYCHIATRIC: No depression. No anxiety.   Otherwise,  a full review of systems performed by myself has been negative.  PAST MEDICAL HISTORY: Hyperlipidemia, hypertension, insulin-dependent diabetes, coronary artery disease, status post  PCI and coronary stenting, glaucoma as well as reflux.   PAST SURGICAL HISTORY: Coronary stent placement, cholecystectomy, and anterior cervical laminectomy.   SOCIAL HISTORY: Remote tobacco. No alcohol. No recreational drug use.   FAMILY HISTORY: Sister: History of stomach cancer. Mother: History of stomach cancer. Sister: History of Alzheimer's and diabetes. Sister: History of Hodgkin's lymphoma, history of diabetes. Brother: Diabetes. Daughter deceased from diabetes and coronary artery disease.   ALLERGIES: AMITRIPTYLINE, BETA BLOCKERS, ELAVIL, LEVAQUIN, NORVASC, AND ZOCOR.   HOME MEDICATIONS: Claritin 5 mg daily, hydrochlorothiazide 25 mg daily, iron 120 mg twice daily, Alphagan 0.1% ophthalmic solution one drop to each eye twice a day, Align probiotic 4 mg once a day, omeprazole 20 mg a day, aspirin 81 mg a day, Crestor 10 mg daily, fish oil 1200 mg 3 times daily with meals, insulin 28 units in the morning and 10 units in the evening, lisinopril 5 mg daily, multivitamin once a day, nitroglycerin 0.4 mg sublingual as needed for chest pain.   PHYSICAL EXAMINATION: VITAL SIGNS: Temperature is 98. Pulse is 79. Respirations are 16. Blood pressure is 118/60 with a pulse oximetry of 97%.  GENERAL: Well-developed, well-nourished 81 year old Caucasian gentleman, no acute distress noted. Pleasant.  HEENT: Normocephalic, atraumatic. Pupils equal and reactive to light. Conjunctivae clear. Sclerae anicteric.  NECK: Supple. Trachea midline. No lymphadenopathy or thyromegaly.  PULMONARY: Symmetric rise and fall of chest. Clear to auscultation throughout.  CARDIOVASCULAR: Regular rhythm, S1, S2. No murmurs, no gallops.  ABDOMEN: Soft, nondistended. Bowel sounds in 4 quadrants. No evidence of bruits, masses. Possible hepatosplenomegaly.  RECTAL: Deferred.  MUSCULOSKELETAL: Moving all 4 extremities. No contractures. No clubbing.  EXTREMITIES: No edema.  PSYCHIATRIC: Alert and oriented x 4. Memory grossly  intact. Appropriate affect and mood.  NEUROLOGICAL: No gross neurological deficits.   LABORATORY, DIAGNOSTIC, AND RADIOLOGICAL DATA: Chemistry panel on admission: Glucose was 156. BUN was 35. Creatinine was 1.58. Sodium was 127. Chloride was low at 96. Comparison this morning's date: Glucose is 112. BUN is 30. Creatinine is 1.60 and sodium is 133. Cardiac enzymes, 3 in series: All within normal limits. Hemoglobin on admission 12.1 with hematocrit of 37.2, platelet count 107,000. In trending, hemoglobin dropped to 11.9 with hematocrit of 36.5, platelet count of 67,000. Third in series at 5:43 this morning: Hemoglobin 12.1 with hematocrit of 37.0 and platelet count of 77,000. Urinalysis: Protein is 100 mg/dL; RBC is one per high-power field. EKG revealed normal sinus rhythm with sinus arrhythmia. Chest PA view: Cardiomegaly without acute process, stable findings.   IMPRESSION: Melena, color probable of stool being black, probable in correlation with him being on iron supplementation on a daily basis. Do note that physical examination by ER physician did reveal evidence of heme-positive stool. The patient has increased risk for chronic gastrointestinal blood loss given findings of angiectasis at the time of colonoscopy in November 2014. In review of EGD as well as a colonoscopy performed by Dr. Mechele CollinElliott in following up of rectal varices as well as grade 2 esophageal varices in the setting of chronic anemia and evidence of thrombocytopenia, do suspect element of cirrhosis. In review again of previous abdominal ultrasound, findings of fatty infiltration of liver and findings of splenomegaly. In speaking with the patient, it appears that he has never been advised of this diagnosis.   PLAN: The patient's presentation will be discussed with  Dr. Dow Adolph, an addendum to follow.   These services provided by Rodman Key, MS, APRN, [facility], FNP, under collaborative agreement with Dow Adolph, MD.    ____________________________ Rodman Key, NP dsh:jcm D: 05/06/2014 13:46:31 ET T: 05/06/2014 14:16:35 ET JOB#: [id number]  cc: Rodman Key, NP, <Dictator> Rodman Key MD ELECTRONICALLY SIGNED 05/06/2014 17:29

## 2015-04-03 NOTE — H&P (Signed)
DATE OF ADMISSION:  05/05/2014  DATE OF SERVICE: 05/06/2014  ADDENDUM:   PLAN: The patient's presentation was discussed with Dr. Dow AdolphMatthew Rein. We will proceed with monitoring hemoglobin at this time. Hemoglobin has remained stable thus far during admission. No recommendation for endoscopic evaluation at this time. We recommend the patient to follow up in our office, after being discharged, with Dr. Shelle Ironein to discuss new diagnosis of cirrhosis and medical management. He is to be discharged on 2 grams sodium diet. We will continue to follow during his hospitalization. Feel that the finding of melena, i.e., black stools is probably in correlation with him being on iron. Again, not completely surprising finding of guaiac-positive stool given the fact that he has had a history of AVMs.   These services provided by Rodman Keyawn S. Bellamie Turney, NP under collaborative agreement with Dow AdolphMatthew Rein, MD.   ____________________________ Rodman Keyawn S. Sherolyn Trettin, NP dsh:kp D: 05/06/2014 15:17:45 ET T: 05/06/2014 17:58:52 ET JOB#: [id number]  cc:  Rodman KeyAWN S Jakeisha Stricker MD ELECTRONICALLY SIGNED 05/08/2014 7:59

## 2015-04-03 NOTE — Consult Note (Signed)
PATIENT NAME:  Maia BreslowWHITE, Kali D MR#:  [id number] DATE OF BIRTH:  02-Aug-1929  DATE OF CONSULTATION:  10/08/2014  REFERRING PHYSICIAN:   CONSULTING PHYSICIAN:  Marcina MillardAlexander Deklen Popelka, MD  PRIMARY CARE PHYSICIAN: Kandyce RudMarcus Babaoff, MD  CARDIOLOGIST: Lamar BlinksBruce J. Kowalski, MD   CHIEF COMPLAINT: Shortness of breath.   HISTORY OF PRESENT ILLNESS: The patient is an 81 year old gentleman with known coronary artery disease, chronic congestive heart failure, hypertension, and diabetes. The patient apparently has been in his usual state of health until [facility] Room with chief complaint of shortness of breath and intermittent chest discomfort. The patient was admitted to telemetry where he has shown clinical improvement after initial diuresis with intravenous furosemide. The patient currently denies chest pain. EKG was nondiagnostic. The patient has ruled out for myocardial infarction by CPK, isoenzymes, and troponin.   PAST MEDICAL HISTORY:  1.  Status PTCA and prior coronary stent.  2.  Chronic systolic congestive heart failure.  3.  Hypertension.  4.  Hyperlipidemia.  5.  Chronic kidney disease.  6.  History of prior CVA.   MEDICATIONS: Aspirin 81 mg daily, HCTZ 25 mg daily, lisinopril 10 mg daily, Crestor 10 mg daily, acetaminophen p.r.n., Ferrex 150 mg daily, loratadine 10 mg daily, omeprazole 20 mg daily, MiraLax 4 to 8 oz daily, fish oil capsules 1500 mg daily, Flomax 0.4 mg daily.   SOCIAL HISTORY: The patient is married, lives with his wife. He has a remote tobacco abuse history.   FAMILY HISTORY: Positive for coronary artery disease.  REVIEW OF SYSTEMS:  CONSTITUTIONAL: No fever or chills.  EYES: No blurry vision.  EARS: No hearing loss.  RESPIRATORY: Shortness of breath as described above.  CARDIOVASCULAR: The patient has had mild intermittent chest discomfort which is chronic as described above.  GASTROINTESTINAL: No nausea, vomiting, or diarrhea.  GENITOURINARY: No  dysuria or hematuria.  ENDOCRINE: No polyuria or polydipsia.  MUSCULOSKELETAL: No arthralgias or myalgias.  NEUROLOGICAL: No focal muscle weakness and no numbness.  PSYCHOLOGICAL: No depression or anxiety.   PHYSICAL EXAMINATION:  VITAL SIGNS: Blood pressure 143/67, pulse 89, respirations 18, temperature 98.2, pulse oximetry 93%.  HEENT: Pupils equal, reactive to light and accommodation.  NECK: Supple without thyromegaly.  LUNGS: Reveals decreased breath sounds in both bases.  CARDIOVASCULAR: Normal JVP. Normal PMI. Regular rate and rhythm. Normal S1, S2. No appreciable gallop, murmur, or rub.  ABDOMEN: Soft and nontender. Pulses were intact bilaterally.  MUSCULOSKELETAL: Normal muscle tone.  NEUROLOGIC: The patient is alert and oriented x 3. Motor and sensory both grossly intact.   IMPRESSION: An 81 year old gentleman with known coronary artery disease with a history of percutaneous transluminal coronary angioplasty and coronary stent who presents with symptoms consistent with congestive heart failure, which is improved after initial diuresis with intravenous furosemide, currently chest pain-free, has ruled out for myocardial infarction by CPK, isoenzymes, and troponin.   RECOMMENDATIONS:  1.  Agree with overall current therapy.  2.  Would defer full-dose anticoagulation.  3.  Continue diuresis.  4.  Would defer further cardiac diagnostics at this time.  5.  If patient does well overnight, remains clinically stable, may consider discharge in a.m.   ____________________________ Marcina MillardAlexander Zaidin Blyden, MD ap:ST D: 10/08/2014 16:53:24 ET T: 10/08/2014 21:33:44 ET JOB#: [id number]  cc: Marcina MillardAlexander Chauncey Bruno, MD, <Dictator> Marcina MillardALEXANDER Jareb Radoncic MD ELECTRONICALLY SIGNED 10/23/2014 8:39

## 2015-04-03 NOTE — Consult Note (Signed)
[name] was discharged when I came by his room.   He reports dark stools but his hgb is nearly normal and he is on iron.  He does appear to have cirrhosis with portal HTN based on his labs, EGD, imaging.  would not perform EGD or colonoscopy unless drop in Hgb or evidence of active bleeding.I will request f/u in my clinic both to follow Hgb and to talk about his cirrhosis.   Electronic Signatures: Michael Cisneros (MD)  (Signed on 27-May-15 16:36)  Authored  Last Updated: 27-May-15 16:36 by Michael Cisneros (MD)

## 2015-04-10 ENCOUNTER — Emergency Department: Admit: 2015-04-10 | Disposition: A | Discharge status: Home / Self Care | Payer: Self-pay | Admitting: Internal Medicine

## 2015-04-10 LAB — CBC WITH DIFFERENTIAL/PLATELET
BASOS PCT: 0.2 %
Basophil #: 0 10*3/uL (ref 0.0–0.1)
EOS ABS: 0 10*3/uL (ref 0.0–0.7)
Eosinophil %: 0.7 %
HCT: 36.6 % — ABNORMAL LOW (ref 40.0–52.0)
HGB: 11.7 g/dL — AB (ref 13.0–18.0)
LYMPHS ABS: 0.3 10*3/uL — AB (ref 1.0–3.6)
Lymphocyte %: 8.4 %
MCH: 29.9 pg (ref 26.0–34.0)
MCHC: 31.9 g/dL — ABNORMAL LOW (ref 32.0–36.0)
MCV: 94 fL (ref 80–100)
MONO ABS: 0.2 x10 3/mm (ref 0.2–1.0)
Monocyte %: 6.5 %
NEUTROS PCT: 84.2 %
Neutrophil #: 3.1 10*3/uL (ref 1.4–6.5)
Platelet: 70 10*3/uL — ABNORMAL LOW (ref 150–440)
RBC: 3.92 10*6/uL — ABNORMAL LOW (ref 4.40–5.90)
RDW: 20.6 % — ABNORMAL HIGH (ref 11.5–14.5)
WBC: 3.7 10*3/uL — ABNORMAL LOW (ref 3.8–10.6)

## 2015-04-10 LAB — URINALYSIS, COMPLETE
BACTERIA: NONE SEEN
Bilirubin,UR: NEGATIVE
Blood: NEGATIVE
Ketone: NEGATIVE
Leukocyte Esterase: NEGATIVE
NITRITE: NEGATIVE
Ph: 7 (ref 4.5–8.0)
Protein: >=500
Specific Gravity: 1.012 (ref 1.003–1.030)

## 2015-04-10 LAB — BASIC METABOLIC PANEL
ANION GAP: 5 — AB (ref 7–16)
BUN: 32 mg/dL — ABNORMAL HIGH
CHLORIDE: 101 mmol/L
Calcium, Total: 8.3 mg/dL — ABNORMAL LOW
Co2: 31 mmol/L
Creatinine: 2.13 mg/dL — ABNORMAL HIGH
EGFR (African American): 32 — ABNORMAL LOW
EGFR (Non-African Amer.): 28 — ABNORMAL LOW
GLUCOSE: 233 mg/dL — AB
POTASSIUM: 4.3 mmol/L
Sodium: 137 mmol/L

## 2015-04-10 LAB — TROPONIN I: Troponin-I: <0.03 ng/mL

## 2015-04-10 LAB — PROTIME-INR
INR: 1.1
Prothrombin Time: 14 secs

## 2015-04-12 IMAGING — US ABDOMEN ULTRASOUND
1 series · 14 of 25 positions shown · non-contrast
Comparison: 09/24/2012

CLINICAL DATA: Splenomegaly.  Portal venous hypertension.

EXAM:
ULTRASOUND ABDOMEN COMPLETE

[Series 1: abdomen ultrasound · 0.23mm/px · 14 of 111 slices shown]
[im 1/111]
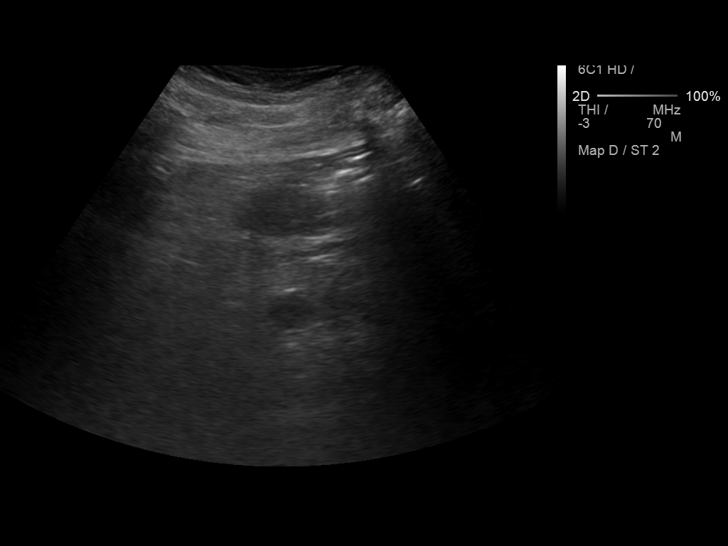
[im 10/111]
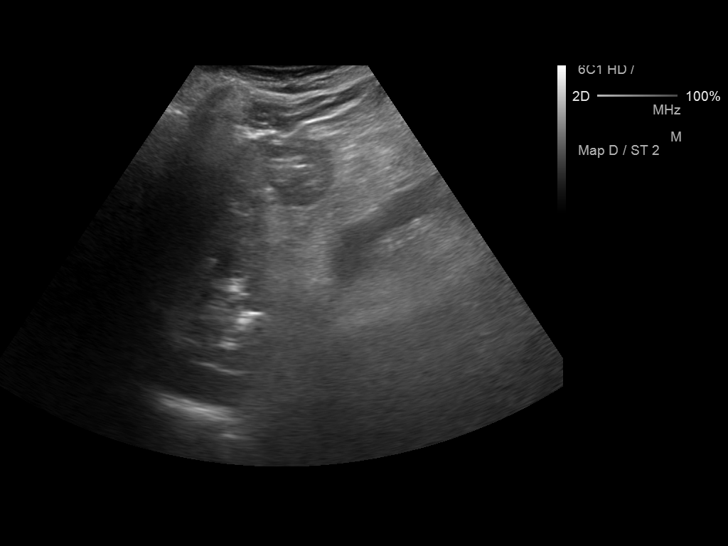
[im 19/111]
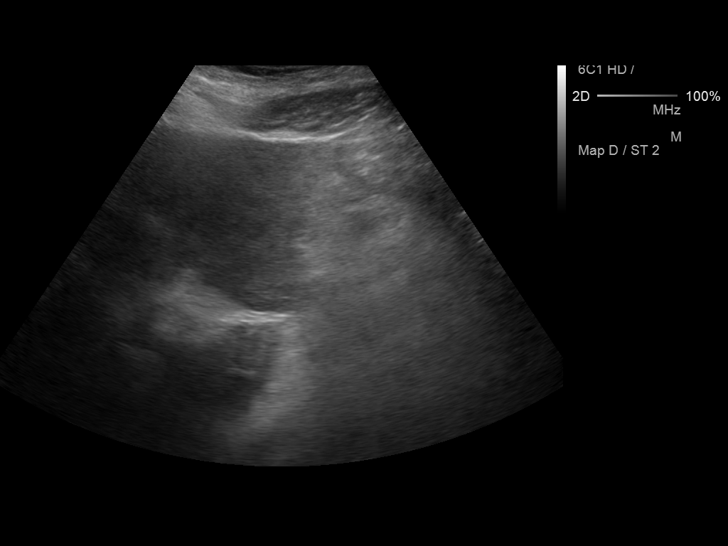
[im 28/111]
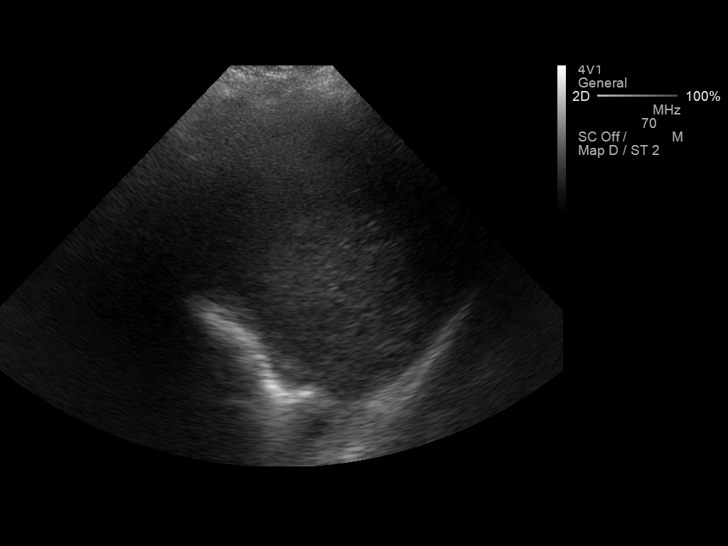
[im 37/111]
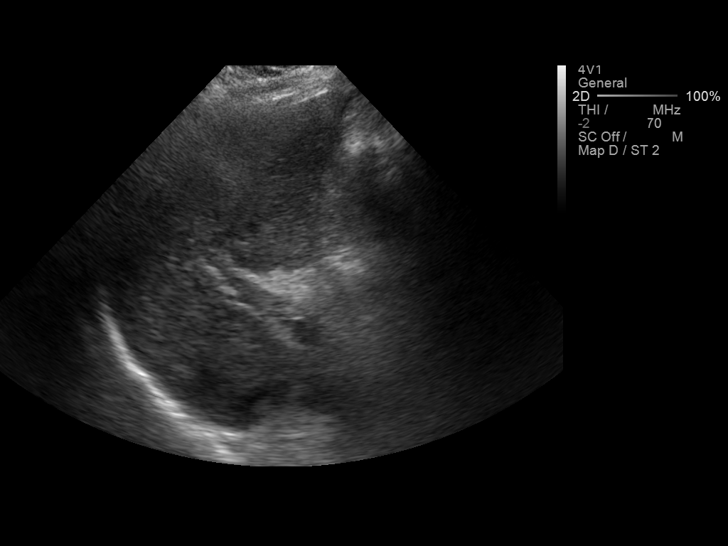
[im 42/111]
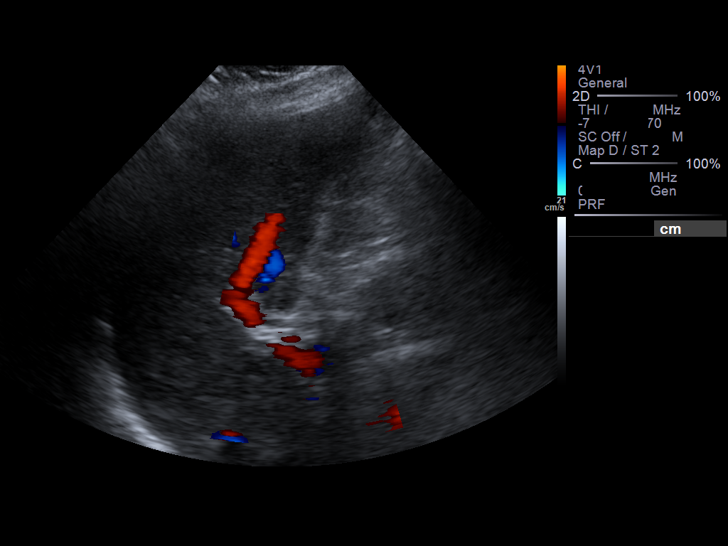
[im 51/111]
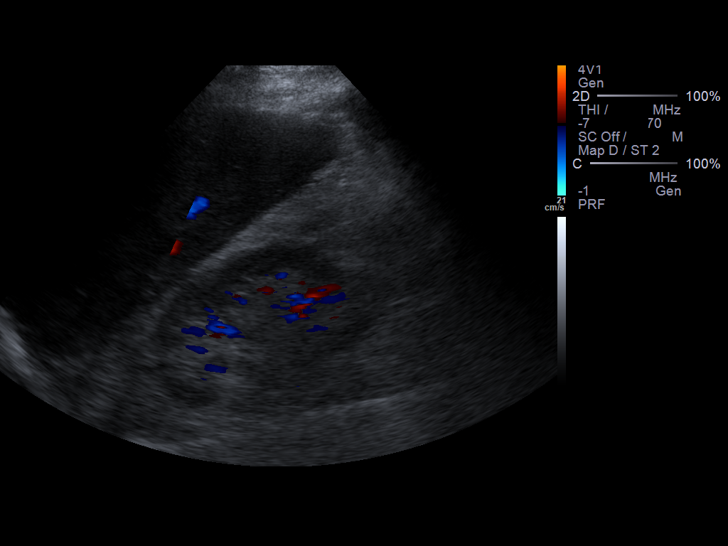
[im 60/111]
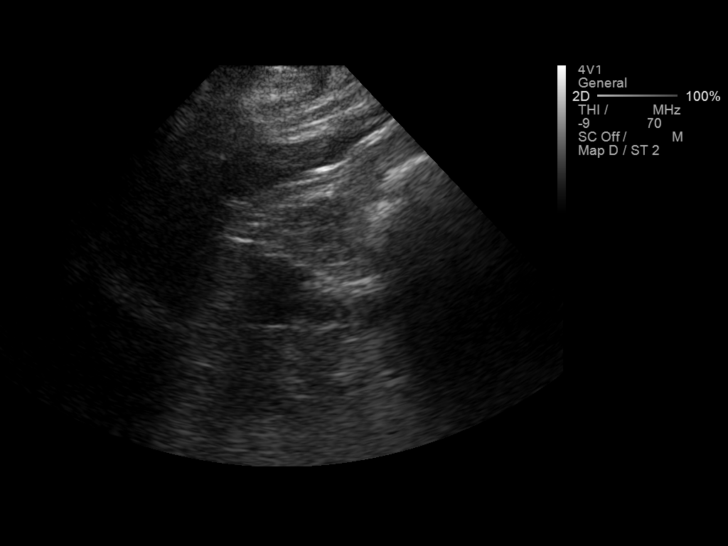
[im 69/111]
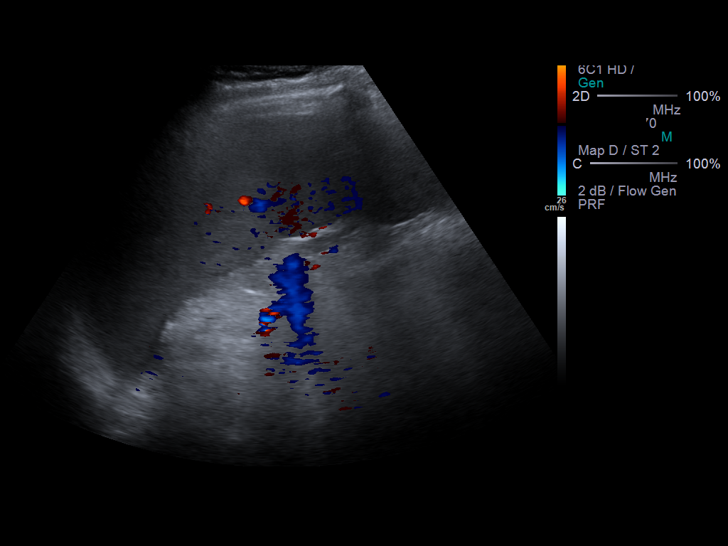
[im 74/111]
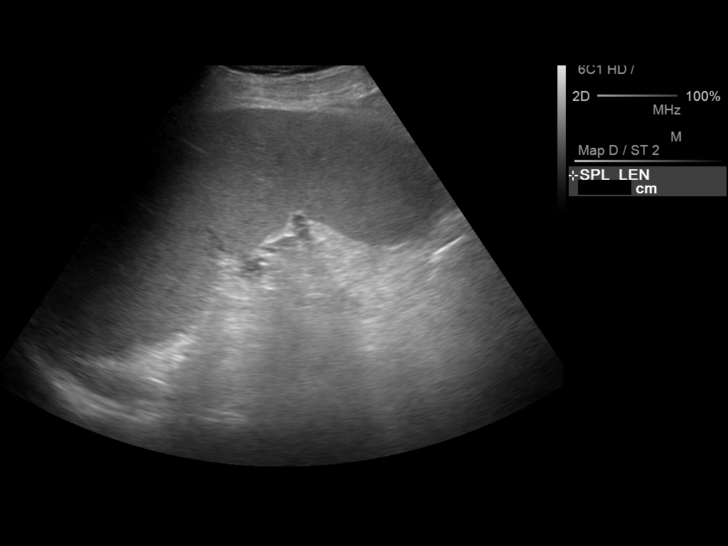
[im 83/111]
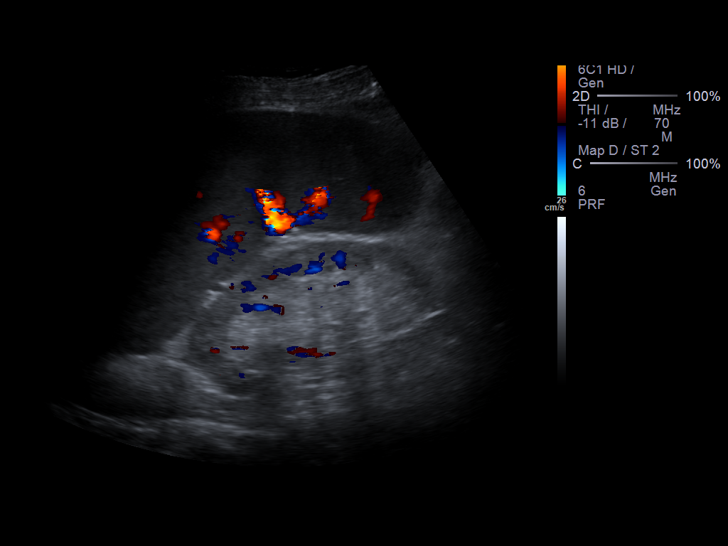
[im 92/111]
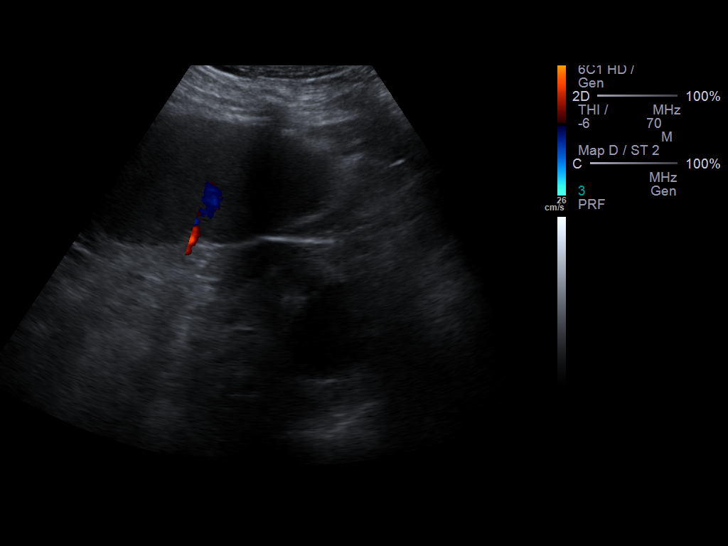
[im 101/111]
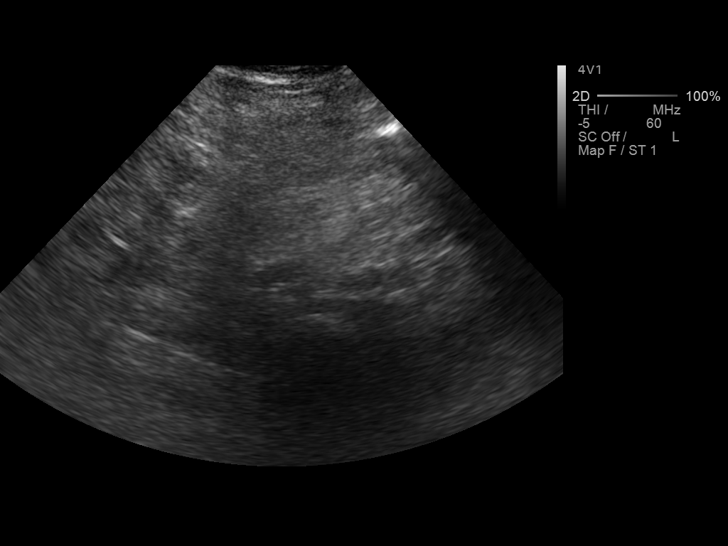
[im 111/111]
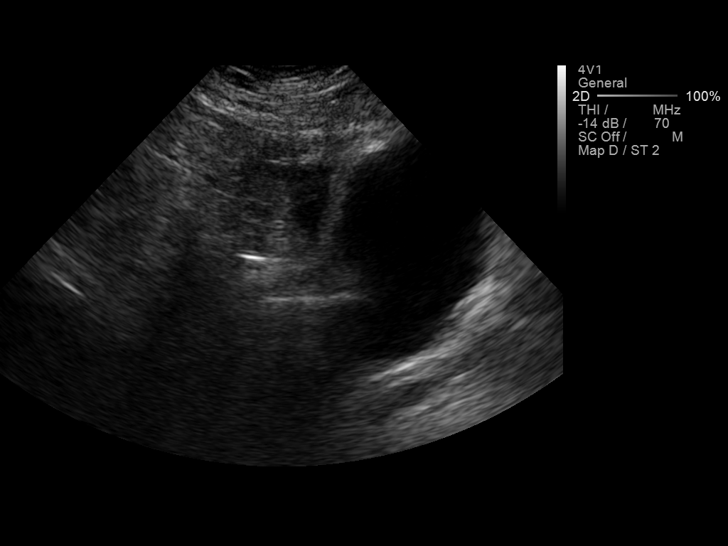

[14 of 25 positions shown; findings below may reference images not displayed]

FINDINGS: Gallbladder:

Surgically absent.

Common bile duct:

Diameter: Nondilated at 3 mm diameter.

Liver:

Coarsened echotexture with decreased through transmission, stable
finding. No focal abnormality. No intrahepatic biliary duct
dilatation.

IVC:

No abnormality visualized.

Pancreas:

Pancreatic head and tail are obscured by overlying bowel gas.

Spleen:

14.8 cm in craniocaudal length. Splenic volume calculated at 800 cc.

Right Kidney:

Length: 9.8 cm.. 2.2 cm simple cyst identified towards the lower
pole.

Left Kidney:

Length: 9.9 cm. 4.0 cm simple cyst identified in the interpolar
region.

Abdominal aorta:

No aneurysm visualized.

Other findings:

There is a trace amount of fluid adjacent to the spleen and in both
lower quadrants of the peroneal cavity.
IMPRESSION: No substantial interval change in size of the spleen.

Trace ascites on today's study.

Bilateral renal cysts.

## 2015-04-13 ENCOUNTER — Emergency Department: Payer: Medicare Other

## 2015-04-13 ENCOUNTER — Emergency Department
Admission: EM | Admit: 2015-04-13 | Discharge: 2015-04-13 | Disposition: A | Discharge status: Home / Self Care | Payer: Medicare Other | Attending: Emergency Medicine | Admitting: Emergency Medicine

## 2015-04-13 ENCOUNTER — Other Ambulatory Visit: Payer: Self-pay

## 2015-04-13 ENCOUNTER — Encounter: Payer: Self-pay | Admitting: Emergency Medicine

## 2015-04-13 DIAGNOSIS — E119 Type 2 diabetes mellitus without complications: Secondary | ICD-10-CM | POA: Diagnosis not present

## 2015-04-13 DIAGNOSIS — J159 Unspecified bacterial pneumonia: Secondary | ICD-10-CM | POA: Insufficient documentation

## 2015-04-13 DIAGNOSIS — R531 Weakness: Secondary | ICD-10-CM | POA: Diagnosis present

## 2015-04-13 DIAGNOSIS — J189 Pneumonia, unspecified organism: Secondary | ICD-10-CM

## 2015-04-13 DIAGNOSIS — I10 Essential (primary) hypertension: Secondary | ICD-10-CM | POA: Insufficient documentation

## 2015-04-13 HISTORY — DX: Essential (primary) hypertension: I10

## 2015-04-13 HISTORY — DX: Type 2 diabetes mellitus without complications: E11.9

## 2015-04-13 HISTORY — DX: Anemia, unspecified: D64.9

## 2015-04-13 HISTORY — DX: Heart failure, unspecified: I50.9

## 2015-04-13 LAB — CBC
HEMATOCRIT: 39 % — AB (ref 40.0–52.0)
HEMOGLOBIN: 12.5 g/dL — AB (ref 13.0–18.0)
MCH: 29.7 pg (ref 26.0–34.0)
MCHC: 32 g/dL (ref 32.0–36.0)
MCV: 92.7 fL (ref 80.0–100.0)
Platelets: 83 10*3/uL — ABNORMAL LOW (ref 150–440)
RBC: 4.21 MIL/uL — ABNORMAL LOW (ref 4.40–5.90)
RDW: 20.3 % — AB (ref 11.5–14.5)
WBC: 4.2 10*3/uL (ref 3.8–10.6)

## 2015-04-13 LAB — TROPONIN I
TROPONIN I: 0.03 ng/mL (ref <0.031)
Troponin I: 0.03 ng/mL (ref <0.031)

## 2015-04-13 LAB — BASIC METABOLIC PANEL
ANION GAP: 6 (ref 5–15)
BUN: 25 mg/dL — ABNORMAL HIGH (ref 6–20)
CALCIUM: 8.9 mg/dL (ref 8.9–10.3)
CO2: 32 mmol/L (ref 22–32)
CREATININE: 1.87 mg/dL — AB (ref 0.61–1.24)
Chloride: 100 mmol/L — ABNORMAL LOW (ref 101–111)
GFR calc non Af Amer: 32 mL/min — ABNORMAL LOW (ref >60)
GFR, EST AFRICAN AMERICAN: 37 mL/min — AB (ref >60)
Glucose, Bld: 137 mg/dL — ABNORMAL HIGH (ref 65–99)
Potassium: 4.2 mmol/L (ref 3.5–5.1)
Sodium: 138 mmol/L (ref 135–145)

## 2015-04-13 IMAGING — CR DG CHEST 1V PORT
1 series · 1 of 1 positions shown · non-contrast
Comparison: Portable chest x-ray May 05, 2014

CLINICAL DATA: Chest pain

EXAM:
PORTABLE CHEST - 1 VIEW

[ap]
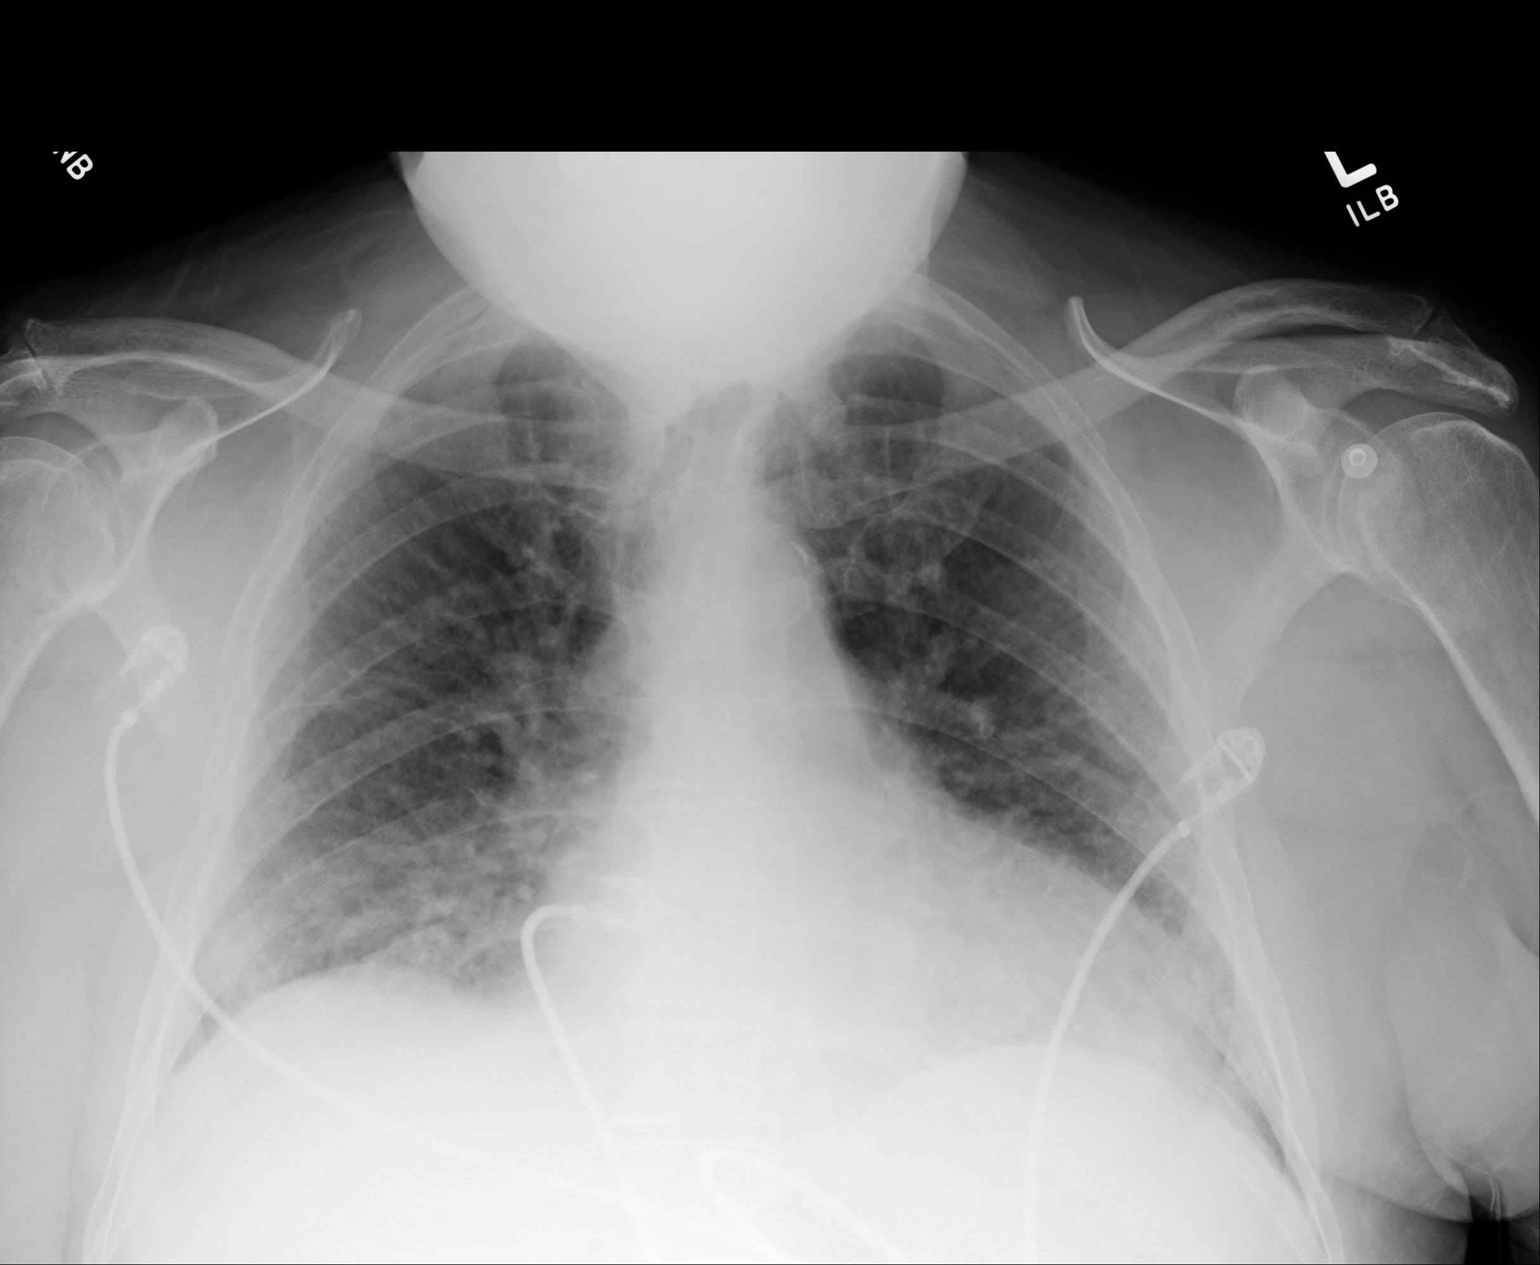

[1 of 1 positions shown; findings below may reference images not displayed]

FINDINGS: The cardiopericardial silhouette remains mildly enlarged. The
pulmonary interstitial markings are more prominent today. There is
no pleural effusion. The central pulmonary vascularity is engorged.
The trachea is midline. The bony thorax is unremarkable.
IMPRESSION: Mild congestive heart failure with interstitial edema.

## 2015-04-13 MED ORDER — LEVOFLOXACIN 750 MG PO TABS: 750.0000 mg | ORAL_TABLET | Freq: Once | ORAL | Status: AC

## 2015-04-13 MED ORDER — LEVOFLOXACIN 500 MG PO TABS
ORAL_TABLET | ORAL | Status: AC
  Administered 2015-04-13: 750 mg
  Filled 2015-04-13: qty 1

## 2015-04-13 MED ORDER — LEVOFLOXACIN 250 MG PO TABS
ORAL_TABLET | ORAL | Status: AC
  Filled 2015-04-13: qty 1

## 2015-04-13 MED ORDER — LEVOFLOXACIN 750 MG PO TABS: 750.0000 mg | ORAL_TABLET | ORAL | Status: AC

## 2015-04-13 NOTE — Discharge Instructions (Signed)
As we discussed, your workup today was reassuring in terms of your lab work and EKG. However, the small area of infection in her lungs does not seem to be significantly better. We prescribed a new, stronger antibiotic that he should take as recommended. He received a dose in the emergency department, so you're next dose should not be until Thursday evening, as you should take the medication every 48 hours instead of every 24 hours; this is based on her kidney function.  Please call your primary care doctor tomorrow morning and ask for the next available appointment to follow-up from your emergency department visits. Hopefully they will be able to see you this week. If her symptoms worsen or if you develop new symptoms that concern you, please return immediately to the emergency department.  Pneumonia, Adult Pneumonia is an infection of the lungs. It may be caused by a germ (virus or bacteria). Some types of pneumonia can spread easily from person to person. This can happen when you cough or sneeze. HOME CARE  Only take medicine as told by your doctor.  Take your medicine (antibiotics) as told. Finish it even if you start to feel better.  Do not smoke.  You may use a vaporizer or humidifier in your room. This can help loosen thick spit (mucus).  Sleep so you are almost sitting up (semi-upright). This helps reduce coughing.  Rest. A shot (vaccine) can help prevent pneumonia. Shots are often advised for:  People over 79 years old.  Patients on chemotherapy.  People with long-term (chronic) lung problems.  People with immune system problems. GET HELP RIGHT AWAY IF:   You are getting worse.  You cannot control your cough, and you are losing sleep.  You cough up blood.  Your pain gets worse, even with medicine.  You have a fever.  Any of your problems are getting worse, not better.  You have shortness of breath or chest pain. MAKE SURE YOU:   Understand these  instructions.  Will watch your condition.  Will get help right away if you are not doing well or get worse. Document Released: 05/15/2008 Document Revised: 02/19/2012 Document Reviewed: 02/17/2011 [facility] Patient Information 2015 [employer] [place]. This information is not intended to replace advice given to you by your health care provider. Make sure you discuss any questions you have with your health care provider.

## 2015-04-13 NOTE — ED Provider Notes (Signed)
[facility] Emergency Department Provider Note    ____________________________________________  Time seen: 1630  I have reviewed the triage vital signs and the nursing notes.   HISTORY  Chief Complaint Weakness       HPI Maia BreslowGeorge D Hazelbaker is a 81 y.o. male who presents with persistent weakness and some increased shortness of breath. He was recently seen and treated in the emergency department for a case of mild pneumonia with a course of azithromycin. He has one dose left, but is concerned because he is feeling no better than before and is having increased shortness of breath especially with exertion. He denies any new chest pain, though he states that he does occasionally have left-sided chest pain was present for the last few years, but this is no worse than usual. His shortness of breath is most noticeable at night when he has been laying down. He has a mild nonproductive cough. Nothing is making his symptoms feel better. He denies nausea, vomiting, abdominal pain, dysuria, diarrhea.     Past Medical History  Diagnosis Date  . Diabetes mellitus without complication   . Hypertension   . Anemia   . Cardiac insufficiency     There are no active problems to display for this patient.   Past Surgical History  Procedure Laterality Date  . Back surgery    . Cholecystectomy      No current outpatient prescriptions on file.  Allergies Review of patient's allergies indicates not on file.  No family history on file.  Social History History  Substance Use Topics  . Smoking status: Never Smoker   . Smokeless tobacco: Not on file  . Alcohol Use: No    Review of Systems  Constitutional: Negative for fever. Eyes: Negative for visual changes. ENT: Negative for sore throat. Cardiovascular: Negative for acute chest pain. Respiratory: Mild shortness of breath especially with exertion. Worse when lying flat. Frequent nonproductive cough, worse at  night. Gastrointestinal: Negative for abdominal pain, vomiting and diarrhea. Genitourinary: Negative for dysuria. Musculoskeletal: Negative for back pain. Skin: Negative for rash. Neurological: Negative for headaches, focal weakness or numbness.   10-point ROS otherwise negative.  ____________________________________________   PHYSICAL EXAM:  VITAL SIGNS: ED Triage Vitals  Enc Vitals Group     BP 04/13/15 1344 159/94 mmHg     Pulse Rate 04/13/15 1344 98     Resp --      Temp 04/13/15 1344 98.3 F (36.8 C)     Temp src --      SpO2 04/13/15 1344 96 %     Weight 04/13/15 1344 190 lb (86.183 kg)     Height 04/13/15 1344 5\' 8"  (1.727 m)     Head Cir --      Peak Flow --      Pain Score --      Pain Loc --      Pain Edu? --      Excl. in GC? --      Constitutional: Alert and oriented. Well appearing and in no distress. Eyes: Conjunctivae are normal. PERRL. Normal extraocular movements. ENT   Head: Normocephalic and atraumatic.   Nose: No congestion/rhinnorhea.   Mouth/Throat: Mucous membranes are moist.   Neck: No stridor. Hematological/Lymphatic/Immunilogical: No cervical lymphadenopathy. Cardiovascular: Normal rate, regular rhythm. Normal and symmetric distal pulses are present in all extremities. No murmurs, rubs, or gallops. Respiratory: Normal respiratory effort without tachypnea nor retractions. Breath sounds are clear and equal bilaterally. Mild expiratory wheezing throughout.  No rales or rhonchi. Gastrointestinal: Soft and nontender. No distention. No abdominal bruits. There is no CVA tenderness. Genitourinary: Deferred Musculoskeletal: Nontender with normal range of motion in all extremities. No joint effusions.  No lower extremity tenderness nor edema. Neurologic:  Normal speech and language. No gross focal neurologic deficits are appreciated. Speech is normal. No acute gait instability (uses a cane at baseline) Skin:  Skin is warm, dry and intact.  No rash noted. Psychiatric: Mood and affect are normal. Speech and behavior are normal. Patient exhibits appropriate insight and judgment.  ____________________________________________    LABS (pertinent positives/negatives)  The patient's creatinine has improved from greater than 2 to 1.87 today. His CBC is unremarkable.  He had 2 troponins separated by nearly 4 hours which were 0.03.  ____________________________________________   EKG  ED ECG REPORT   Date: 04/13/2015  EKG Time: 17:48  Rate: 95  Rhythm: normal sinus rhythm, occasional PVC noted, unifocal, 1st degree AV block  Axis: Normal  Intervals:first-degree A-V block   ST&T Change: Nonspecific ST and T-wave changes that do not indicate acute ischemia   ____________________________________________    RADIOLOGY  Two-view chest x-ray shows stable bilateral small pleural effusions and a stable patchy left basilar infiltrate. The radiologist does not appreciate a new opacity.  ____________________________________________   PROCEDURES  Procedure(s) performed: None  Critical Care performed: No  ____________________________________________   INITIAL IMPRESSION / ASSESSMENT AND PLAN / ED COURSE  Pertinent labs & imaging results that were available during my care of the patient were reviewed by me and considered in my medical decision making (see chart for details).  The patient is a generally well-appearing man who his been on outpatient azithromycin for a mild pneumonia. He is in no acute distress with stable vital signs. His lab work and chest x-ray indicate that while he is no worse, and in fact his mild dehydration has improved since his last visit about 5 days ago, his infiltrate in the left lung base has not improved significantly. I will put him on a course of Levaquin and appropriate for his creatinine clearance of 37 (Levaquin 750 q48hours per UpToDate). I stressed the importance of calling his primary care  physician tomorrow to schedule the next available appointment, preferably by the end of this week. I gave my usual customary return precautions to the patient as wife. They understand and agree with the plan. They are anxious to go home.  ____________________________________________   FINAL CLINICAL IMPRESSION(S) / ED DIAGNOSES  Final diagnoses:  Community acquired pneumonia     Loleta Rose, MD 04/13/15 [phone]

## 2015-04-13 NOTE — ED Notes (Signed)
Pt states he was seen here this weekend dx withpneumonia nd put on antibiotics but still feels weak.  Denies pain

## 2015-04-13 NOTE — ED Notes (Signed)
Patient given 750mg  of Levaquin per Doctor's order.

## 2015-05-20 ENCOUNTER — Encounter: Payer: Self-pay | Admitting: Emergency Medicine

## 2015-05-20 ENCOUNTER — Inpatient Hospital Stay
Admission: EM | Admit: 2015-05-20 | Discharge: 2015-06-11 | DRG: 291 | Disposition: E | Payer: Medicare Other | Attending: Internal Medicine | Admitting: Internal Medicine

## 2015-05-20 ENCOUNTER — Emergency Department: Payer: Medicare Other

## 2015-05-20 DIAGNOSIS — I5033 Acute on chronic diastolic (congestive) heart failure: Principal | ICD-10-CM | POA: Diagnosis present

## 2015-05-20 DIAGNOSIS — I639 Cerebral infarction, unspecified: Secondary | ICD-10-CM

## 2015-05-20 DIAGNOSIS — Z794 Long term (current) use of insulin: Secondary | ICD-10-CM | POA: Diagnosis not present

## 2015-05-20 DIAGNOSIS — I129 Hypertensive chronic kidney disease with stage 1 through stage 4 chronic kidney disease, or unspecified chronic kidney disease: Secondary | ICD-10-CM | POA: Diagnosis present

## 2015-05-20 DIAGNOSIS — Z66 Do not resuscitate: Secondary | ICD-10-CM | POA: Diagnosis present

## 2015-05-20 DIAGNOSIS — J9622 Acute and chronic respiratory failure with hypercapnia: Secondary | ICD-10-CM | POA: Diagnosis present

## 2015-05-20 DIAGNOSIS — Z515 Encounter for palliative care: Secondary | ICD-10-CM | POA: Diagnosis not present

## 2015-05-20 DIAGNOSIS — Z833 Family history of diabetes mellitus: Secondary | ICD-10-CM | POA: Diagnosis not present

## 2015-05-20 DIAGNOSIS — E039 Hypothyroidism, unspecified: Secondary | ICD-10-CM | POA: Diagnosis present

## 2015-05-20 DIAGNOSIS — I4891 Unspecified atrial fibrillation: Secondary | ICD-10-CM | POA: Diagnosis present

## 2015-05-20 DIAGNOSIS — F0390 Unspecified dementia without behavioral disturbance: Secondary | ICD-10-CM | POA: Diagnosis present

## 2015-05-20 DIAGNOSIS — K219 Gastro-esophageal reflux disease without esophagitis: Secondary | ICD-10-CM | POA: Diagnosis present

## 2015-05-20 DIAGNOSIS — N183 Chronic kidney disease, stage 3 (moderate): Secondary | ICD-10-CM | POA: Diagnosis present

## 2015-05-20 DIAGNOSIS — Z9049 Acquired absence of other specified parts of digestive tract: Secondary | ICD-10-CM | POA: Diagnosis present

## 2015-05-20 DIAGNOSIS — D649 Anemia, unspecified: Secondary | ICD-10-CM | POA: Diagnosis present

## 2015-05-20 DIAGNOSIS — E1165 Type 2 diabetes mellitus with hyperglycemia: Secondary | ICD-10-CM | POA: Diagnosis present

## 2015-05-20 DIAGNOSIS — Z79899 Other long term (current) drug therapy: Secondary | ICD-10-CM | POA: Diagnosis not present

## 2015-05-20 DIAGNOSIS — I509 Heart failure, unspecified: Secondary | ICD-10-CM | POA: Diagnosis present

## 2015-05-20 DIAGNOSIS — E785 Hyperlipidemia, unspecified: Secondary | ICD-10-CM | POA: Diagnosis present

## 2015-05-20 DIAGNOSIS — J9621 Acute and chronic respiratory failure with hypoxia: Secondary | ICD-10-CM | POA: Diagnosis present

## 2015-05-20 DIAGNOSIS — Z888 Allergy status to other drugs, medicaments and biological substances status: Secondary | ICD-10-CM | POA: Diagnosis not present

## 2015-05-20 DIAGNOSIS — Z9889 Other specified postprocedural states: Secondary | ICD-10-CM

## 2015-05-20 DIAGNOSIS — E871 Hypo-osmolality and hyponatremia: Secondary | ICD-10-CM | POA: Diagnosis present

## 2015-05-20 DIAGNOSIS — Z8249 Family history of ischemic heart disease and other diseases of the circulatory system: Secondary | ICD-10-CM

## 2015-05-20 DIAGNOSIS — J441 Chronic obstructive pulmonary disease with (acute) exacerbation: Secondary | ICD-10-CM | POA: Diagnosis present

## 2015-05-20 DIAGNOSIS — G9340 Encephalopathy, unspecified: Secondary | ICD-10-CM | POA: Diagnosis present

## 2015-05-20 HISTORY — DX: Chronic obstructive pulmonary disease, unspecified: J44.9

## 2015-05-20 HISTORY — DX: Heart failure, unspecified: I50.9

## 2015-05-20 LAB — COMPREHENSIVE METABOLIC PANEL
ALBUMIN: 2.9 g/dL — AB (ref 3.5–5.0)
ALK PHOS: 118 U/L (ref 38–126)
ALT: 26 U/L (ref 17–63)
AST: 43 U/L — ABNORMAL HIGH (ref 15–41)
Anion gap: 6 (ref 5–15)
BILIRUBIN TOTAL: 0.5 mg/dL (ref 0.3–1.2)
BUN: 32 mg/dL — ABNORMAL HIGH (ref 6–20)
CALCIUM: 8.1 mg/dL — AB (ref 8.9–10.3)
CO2: 31 mmol/L (ref 22–32)
Chloride: 92 mmol/L — ABNORMAL LOW (ref 101–111)
Creatinine, Ser: 2.03 mg/dL — ABNORMAL HIGH (ref 0.61–1.24)
GFR calc Af Amer: 33 mL/min — ABNORMAL LOW (ref >60)
GFR calc non Af Amer: 29 mL/min — ABNORMAL LOW (ref >60)
Glucose, Bld: 136 mg/dL — ABNORMAL HIGH (ref 65–99)
Potassium: 4.8 mmol/L (ref 3.5–5.1)
Sodium: 129 mmol/L — ABNORMAL LOW (ref 135–145)
TOTAL PROTEIN: 5.8 g/dL — AB (ref 6.5–8.1)

## 2015-05-20 LAB — CBC WITH DIFFERENTIAL/PLATELET
Basophils Absolute: 0 10*3/uL (ref 0–0.1)
Basophils Relative: 0 %
Eosinophils Absolute: 0 10*3/uL (ref 0–0.7)
Eosinophils Relative: 1 %
HEMATOCRIT: 33.3 % — AB (ref 40.0–52.0)
Hemoglobin: 10.5 g/dL — ABNORMAL LOW (ref 13.0–18.0)
Lymphocytes Relative: 9 %
Lymphs Abs: 0.4 10*3/uL — ABNORMAL LOW (ref 1.0–3.6)
MCH: 29.7 pg (ref 26.0–34.0)
MCHC: 31.6 g/dL — AB (ref 32.0–36.0)
MCV: 94.2 fL (ref 80.0–100.0)
Monocytes Absolute: 0.3 10*3/uL (ref 0.2–1.0)
Monocytes Relative: 7 %
Neutro Abs: 3.6 10*3/uL (ref 1.4–6.5)
Neutrophils Relative %: 83 %
Platelets: 76 10*3/uL — ABNORMAL LOW (ref 150–440)
RBC: 3.53 MIL/uL — AB (ref 4.40–5.90)
RDW: 17.9 % — ABNORMAL HIGH (ref 11.5–14.5)
WBC: 4.4 10*3/uL (ref 3.8–10.6)

## 2015-05-20 LAB — TROPONIN I: Troponin I: 0.03 ng/mL (ref <0.031)

## 2015-05-20 LAB — BRAIN NATRIURETIC PEPTIDE: B NATRIURETIC PEPTIDE 5: 568 pg/mL — AB (ref 0.0–100.0)

## 2015-05-20 MED ORDER — ACETAMINOPHEN 325 MG PO TABS
650.0000 mg | ORAL_TABLET | Freq: Four times a day (QID) | ORAL | Status: DC | PRN
  Administered 2015-05-22: 650 mg via ORAL
  Filled 2015-05-20: qty 2

## 2015-05-20 MED ORDER — LEVOTHYROXINE SODIUM 25 MCG PO TABS
25.0000 ug | ORAL_TABLET | Freq: Every day | ORAL | Status: DC
  Administered 2015-05-22: 25 ug via ORAL
  Filled 2015-05-20 (×2): qty 1

## 2015-05-20 MED ORDER — PANTOPRAZOLE SODIUM 40 MG PO TBEC
40.0000 mg | DELAYED_RELEASE_TABLET | Freq: Every day | ORAL | Status: DC
  Administered 2015-05-20 – 2015-05-22 (×2): 40 mg via ORAL
  Filled 2015-05-20 (×2): qty 1

## 2015-05-20 MED ORDER — FUROSEMIDE 10 MG/ML IJ SOLN
INTRAMUSCULAR | Status: AC
  Filled 2015-05-20: qty 4

## 2015-05-20 MED ORDER — NITROGLYCERIN 0.4 MG SL SUBL: 0.4000 mg | SUBLINGUAL_TABLET | SUBLINGUAL | Status: DC | PRN

## 2015-05-20 MED ORDER — INSULIN GLARGINE 100 UNIT/ML ~~LOC~~ SOLN
30.0000 [IU] | Freq: Every day | SUBCUTANEOUS | Status: DC
  Administered 2015-05-21: 10 [IU] via SUBCUTANEOUS
  Administered 2015-05-22: 30 [IU] via SUBCUTANEOUS
  Filled 2015-05-20 (×4): qty 0.3

## 2015-05-20 MED ORDER — PANTOPRAZOLE SODIUM 40 MG PO TBEC
DELAYED_RELEASE_TABLET | ORAL | Status: AC
  Administered 2015-05-20: 40 mg via ORAL
  Filled 2015-05-20: qty 1

## 2015-05-20 MED ORDER — METOPROLOL TARTRATE 25 MG PO TABS
25.0000 mg | ORAL_TABLET | Freq: Every day | ORAL | Status: DC
  Administered 2015-05-20 – 2015-05-22 (×2): 25 mg via ORAL
  Filled 2015-05-20 (×3): qty 1

## 2015-05-20 MED ORDER — FUROSEMIDE 10 MG/ML IJ SOLN
40.0000 mg | Freq: Two times a day (BID) | INTRAMUSCULAR | Status: DC
  Filled 2015-05-20: qty 4

## 2015-05-20 MED ORDER — PANTOPRAZOLE SODIUM 40 MG PO TBEC: 40.0000 mg | DELAYED_RELEASE_TABLET | Freq: Every day | ORAL | Status: DC

## 2015-05-20 MED ORDER — GABAPENTIN 300 MG PO CAPS
300.0000 mg | ORAL_CAPSULE | Freq: Every day | ORAL | Status: DC
  Administered 2015-05-20 – 2015-05-21 (×2): 300 mg via ORAL
  Filled 2015-05-20 (×2): qty 1

## 2015-05-20 MED ORDER — ROSUVASTATIN CALCIUM 5 MG PO TABS
10.0000 mg | ORAL_TABLET | Freq: Every day | ORAL | Status: DC
  Administered 2015-05-22: 10 mg via ORAL
  Filled 2015-05-20 (×2): qty 1

## 2015-05-20 MED ORDER — RISAQUAD PO CAPS
1.0000 | ORAL_CAPSULE | Freq: Every day | ORAL | Status: DC
  Administered 2015-05-22: 1 via ORAL
  Filled 2015-05-20 (×3): qty 1

## 2015-05-20 MED ORDER — FUROSEMIDE 10 MG/ML IJ SOLN
40.0000 mg | Freq: Once | INTRAMUSCULAR | Status: AC
  Administered 2015-05-20: 40 mg via INTRAVENOUS

## 2015-05-20 MED ORDER — TAMSULOSIN HCL 0.4 MG PO CAPS
0.4000 mg | ORAL_CAPSULE | Freq: Every day | ORAL | Status: DC
  Administered 2015-05-22: 0.4 mg via ORAL
  Filled 2015-05-20 (×2): qty 1

## 2015-05-20 MED ORDER — ACETAMINOPHEN 650 MG RE SUPP: 650.0000 mg | Freq: Four times a day (QID) | RECTAL | Status: DC | PRN

## 2015-05-20 MED ORDER — ONDANSETRON HCL 4 MG/2ML IJ SOLN: 4.0000 mg | Freq: Four times a day (QID) | INTRAMUSCULAR | Status: DC | PRN

## 2015-05-20 MED ORDER — BRIMONIDINE TARTRATE 0.15 % OP SOLN
1.0000 [drp] | Freq: Two times a day (BID) | OPHTHALMIC | Status: DC
  Administered 2015-05-20 – 2015-05-22 (×3): 1 [drp] via OPHTHALMIC
  Filled 2015-05-20 (×2): qty 5

## 2015-05-20 MED ORDER — LORATADINE 10 MG PO TABS
10.0000 mg | ORAL_TABLET | Freq: Every day | ORAL | Status: DC
  Administered 2015-05-22: 10 mg via ORAL
  Filled 2015-05-20 (×2): qty 1

## 2015-05-20 MED ORDER — INSULIN ASPART 100 UNIT/ML ~~LOC~~ SOLN
0.0000 [IU] | Freq: Three times a day (TID) | SUBCUTANEOUS | Status: DC
  Administered 2015-05-22: 2 [IU] via SUBCUTANEOUS
  Filled 2015-05-20: qty 2

## 2015-05-20 MED ORDER — SENNOSIDES-DOCUSATE SODIUM 8.6-50 MG PO TABS: 1.0000 | ORAL_TABLET | Freq: Every evening | ORAL | Status: DC | PRN

## 2015-05-20 MED ORDER — ASPIRIN EC 81 MG PO TBEC
81.0000 mg | DELAYED_RELEASE_TABLET | Freq: Every day | ORAL | Status: DC
  Administered 2015-05-22: 81 mg via ORAL
  Filled 2015-05-20 (×2): qty 1

## 2015-05-20 MED ORDER — POLYSACCHARIDE IRON COMPLEX 150 MG PO CAPS
150.0000 mg | ORAL_CAPSULE | Freq: Two times a day (BID) | ORAL | Status: DC
  Administered 2015-05-20 – 2015-05-22 (×3): 150 mg via ORAL
  Filled 2015-05-20 (×6): qty 1

## 2015-05-20 MED ORDER — FUROSEMIDE 10 MG/ML IJ SOLN: 40.0000 mg | Freq: Two times a day (BID) | INTRAMUSCULAR | Status: DC

## 2015-05-20 MED ORDER — ONDANSETRON HCL 4 MG PO TABS: 4.0000 mg | ORAL_TABLET | Freq: Four times a day (QID) | ORAL | Status: DC | PRN

## 2015-05-20 MED ORDER — OMEGA-3-ACID ETHYL ESTERS 1 G PO CAPS
1.0000 g | ORAL_CAPSULE | Freq: Every day | ORAL | Status: DC
  Administered 2015-05-22: 1 g via ORAL
  Filled 2015-05-20 (×2): qty 1

## 2015-05-20 MED ORDER — HEPARIN SODIUM (PORCINE) 5000 UNIT/ML IJ SOLN
5000.0000 [IU] | Freq: Three times a day (TID) | INTRAMUSCULAR | Status: DC
  Administered 2015-05-20 – 2015-05-22 (×5): 5000 [IU] via SUBCUTANEOUS
  Filled 2015-05-20 (×5): qty 1

## 2015-05-20 NOTE — ED Notes (Signed)
Patient was seen at [facility] for routine follow up appointment when his MD noticed he was working hard to breathe and felt that he may be having a COPD exacerbation.  Patient arrived via EMS from [facility] office.  Per EMS patient did not c/o shortness of breath but has been feeling tired and weak.  He has been falling at home a lot and has gained over 20lbs in the last month.  Abdomen is distended and lower extremities are edematous.

## 2015-05-20 NOTE — H&P (Signed)
[facility] - [place] at [facility]   PATIENT NAME: Michael Cisneros    MR#:  [id number]  DATE OF BIRTH:  [birth year]  DATE OF ADMISSION:  05/22/2015  PRIMARY CARE PHYSICIAN: BABAOFF, Lavada Mesi, MD   REQUESTING/REFERRING PHYSICIAN: Dr. Shaune Pollack  CHIEF COMPLAINT:  Shortness of breath  HISTORY OF PRESENT ILLNESS:  Michael Cisneros  is a 81 y.o. male with a known history of insulin requiring diabetes mellitus, congestive heart failure, hypertension and COPD is brought into the ED with a chief complaint of shortness of breath. Patient denies any chest pain but feeling nauseous. Also complaining of generalized weakness and lower extremity edema. Patient has reported that he has gained approximately 20 pounds over the past 4 weeks. In the ED his pulse ox was at 77% on room air. Patient was placed on 2 L of oxygen via nasal cannula and his pulse ox went up to 96%. Chest x-ray has revealed pulmonary congestion. Patient was given Lasix IV  PAST MEDICAL HISTORY:   Past Medical History  Diagnosis Date  . Diabetes mellitus without complication   . Hypertension   . Anemia   . Cardiac insufficiency   . CHF (congestive heart failure)   . COPD (chronic obstructive pulmonary disease)     PAST SURGICAL HISTOIRY:   Past Surgical History  Procedure Laterality Date  . Back surgery    . Cholecystectomy      SOCIAL HISTORY:   History  Substance Use Topics  . Smoking status: Never Smoker   . Smokeless tobacco: Not on file  . Alcohol Use: No    FAMILY HISTORY:   Diabetes and HTN DRUG ALLERGIES:   Allergies  Allergen Reactions  . Amitriptyline Other (See Comments)    Reaction:  Unknown   . Amlodipine Other (See Comments)    Reaction:  Unknown   . Beta Adrenergic Blockers Other (See Comments)    Reaction:  Unknown   . Levofloxacin Other (See Comments)    Reaction:  Unknown   . Rosiglitazone Other (See Comments)    Reaction:  Unknown   . Simvastatin Other (See Comments)     Reaction:  Unknown     REVIEW OF SYSTEMS:  CONSTITUTIONAL: No fever, reporting fatigue and weakness.  EYES: No blurred or double vision.  EARS, NOSE, AND THROAT: No tinnitus or ear pain.  RESPIRATORY: No cough, shortness of breath, wheezing or hemoptysis.  CARDIOVASCULAR: Reporting shortness of breath and weight gain of 20 pounds in the past 4 weeks No chest pain GASTROINTESTINAL: No nausea, vomiting, diarrhea or abdominal pain.  GENITOURINARY: No dysuria, hematuria.  ENDOCRINE: No polyuria, nocturia,  HEMATOLOGY: No anemia, easy bruising or bleeding SKIN: No rash or lesion. MUSCULOSKELETAL: No joint pain or arthritis. 2-3+  Pitting edema of the lower extremities, and multiple excoriations NEUROLOGIC: No tingling, numbness, weakness.  PSYCHIATRY: No anxiety or depression.   MEDICATIONS AT HOME:   Prior to Admission medications   Medication Sig Start Date End Date Taking? Authorizing Provider  brimonidine (ALPHAGAN) 0.15 % ophthalmic solution Place 1 drop into both eyes 2 (two) times daily.   Yes Historical Provider, MD  diphenhydramine-acetaminophen (TYLENOL PM) 25-500 MG TABS Take 1-2 tablets by mouth at bedtime as needed (for sleep).    Yes Historical Provider, MD  furosemide (LASIX) 20 MG tablet Take 20 mg by mouth daily as needed for edema.   Yes Historical Provider, MD  gabapentin (NEURONTIN) 300 MG capsule Take 300 mg by mouth at bedtime.  Yes Historical Provider, MD  hydrochlorothiazide (HYDRODIURIL) 25 MG tablet Take 25 mg by mouth daily.   Yes Historical Provider, MD  insulin glargine (LANTUS) 100 UNIT/ML injection Inject 30 Units into the skin daily.   Yes Historical Provider, MD  iron polysaccharides (NIFEREX) 150 MG capsule Take 150 mg by mouth 2 (two) times daily.   Yes Historical Provider, MD  levothyroxine (SYNTHROID, LEVOTHROID) 25 MCG tablet Take 25 mcg by mouth daily.   Yes Historical Provider, MD  lisinopril (PRINIVIL,ZESTRIL) 10 MG tablet Take 10 mg by mouth  daily.   Yes Historical Provider, MD  loratadine (CLARITIN) 10 MG tablet Take 10 mg by mouth daily.   Yes Historical Provider, MD  metoprolol tartrate (LOPRESSOR) 25 MG tablet Take 25 mg by mouth daily.   Yes Historical Provider, MD  Multiple Vitamins-Minerals (MULTIVITAMIN PO) Take 1 tablet by mouth daily.   Yes Historical Provider, MD  nitroGLYCERIN (NITROSTAT) 0.4 MG SL tablet Place 0.4 mg under the tongue every 5 (five) minutes as needed for chest pain.   Yes Historical Provider, MD  Omega-3 Fatty Acids (FISH OIL PO) Take 1 capsule by mouth daily.   Yes Historical Provider, MD  omeprazole (PRILOSEC) 20 MG capsule Take 20 mg by mouth daily.   Yes Historical Provider, MD  Probiotic Product (ALIGN) 4 MG CAPS Take 1 capsule by mouth daily.   Yes Historical Provider, MD  rosuvastatin (CRESTOR) 10 MG tablet Take 10 mg by mouth daily.   Yes Historical Provider, MD  tamsulosin (FLOMAX) 0.4 MG CAPS capsule Take 0.4 mg by mouth daily.   Yes Historical Provider, MD      VITAL SIGNS:  Blood pressure 139/63, pulse 86, temperature 98.6 F (37 C), temperature source Oral, resp. rate 13, height 5\' 4"  (1.626 m), weight 93.441 kg (206 lb), SpO2 97 %.  PHYSICAL EXAMINATION:  GENERAL:  81 y.o.-year-old patient lying in the bed with no acute distress.  EYES: Pupils equal, round, reactive to light and accommodation. No scleral icterus. Extraocular muscles intact.  HEENT: Head atraumatic, normocephalic. Oropharynx and nasopharynx clear.  NECK:  Supple, no jugular venous distention. No thyroid enlargement, no tenderness.  LUNGS: Diffuse rales and rhonchi are present. Minimal cardiac wheezing , no crepitation. No use of accessory muscles of respiration.  CARDIOVASCULAR: S1, S2 normal. No murmurs, rubs, or gallops.  ABDOMEN: Soft, nontender, nondistended. Bowel sounds present. No organomegaly or mass.  EXTREMITIES: No pedal edema, cyanosis, or clubbing.  NEUROLOGIC: Cranial nerves II through XII are intact.  Muscle strength 5/5 in all extremities. Sensation intact. Gait not checked. Hard of hearing PSYCHIATRIC: The patient is alert and oriented x 3.  SKIN: No obvious rash, lesion, or ulcer. Lower extremities with multiple excoriations  LABORATORY PANEL:   CBC  Recent Labs Lab 06/05/2015 1650  WBC 4.4  HGB 10.5*  HCT 33.3*  PLT 76*   ------------------------------------------------------------------------------------------------------------------  Chemistries   Recent Labs Lab 05/19/2015 1650  NA 129*  K 4.8  CL 92*  CO2 31  GLUCOSE 136*  BUN 32*  CREATININE 2.03*  CALCIUM 8.1*  AST 43*  ALT 26  ALKPHOS 118  BILITOT 0.5   ------------------------------------------------------------------------------------------------------------------  Cardiac Enzymes  Recent Labs Lab 05/13/2015 1650  TROPONINI 0.03   ------------------------------------------------------------------------------------------------------------------  RADIOLOGY:  Dg Chest 2 View  05/15/2015   CLINICAL DATA:  CHF and shortness of breath.  EXAM: CHEST - 2 VIEW  COMPARISON:  04/13/2015  FINDINGS: There is evidence of mild congestive heart failure and bilateral pleural effusions with  a moderate left and small right effusion. Associated bibasilar atelectasis. The heart size is stable.  IMPRESSION: Congestive heart failure with bilateral pleural effusions, left greater than right.   Electronically Signed   By: Irish Lack M.D.   On: 05/29/2015 18:00    EKG:   Orders placed or performed during the hospital encounter of 04/13/15  . EKG   Sinus tachycardia at 92 bpm. No acute ST-T wave changes. Poor quality EKG with artifact IMPRESSION AND PLAN:   #1 Acute respiratory distress secondary to acute exacerbation of CHF Admit to telemetry Lasix 40 g IV every 12 hours Strict intake and output Weight monitoring Continue aspirin, beta blocker, statin and holding ACE inhibitor in view of worsening of renal  function Echocardiogram Cardiology consult to Dr. Gwen Pounds Cycle cardiac biomarkers  #2 chronic history of insulin requiring diabetes mellitus Will continue home medications Lantus 30 units subcutaneous every morning Insulin sliding scale  #3 COPD Currently not under exacerbation. Will provide neb laser treatments as needed basis  #4 hypertension Continue home medications ACE inhibitor, beta blocker and Lasix  #5 hypothyroidism Continue Synthroid  #6 acute on chronic renal insufficiency In May 2016 his creatinine was at 1.87 Will hold ACE inhibitor and other nephrotoxins. Currently patient is on Lasix which might worsen the renal function. Will continue close monitoring.  #7 DVT prophylaxis with the heparin subcutaneous  #7 GI prophylaxis with Protonix    All the records are reviewed and case discussed with ED provider. Management plans discussed with the patient, family and they are in agreement.  CODE STATUS: Full code, wife and son on the medical power of attorney  TOTAL TIME TAKING CARE OF THIS PATIENT: 50 minutes.    Ramonita Lab M.D on 05/27/2015 at 7:35 PM  Between 7am to 6pm - Pager - [phone]  After 6pm go to [email or web address] - password [email or web address] [facility]  [facility]  Office  [phone]  CC: Primary care physician; BABAOFF, Lavada Mesi, MD

## 2015-05-20 NOTE — ED Provider Notes (Signed)
[facility] Emergency Department Provider Note   ____________________________________________  Time seen: 5:20 PM I have reviewed the triage vital signs and the triage nursing note.  HISTORY  Chief Complaint Shortness of Breath   Historian Patient  HPI Michael Cisneros is a 81 y.o. male who is sent for [facility] where he was being seen for routine follow-up appointment and was noted to be having trouble breathing. The patient states he does not really feel short of breath and has had no chest pain, however he has had increased feeling of generalized weakness and being tired including lower extremity edema which is moderate and worse as well as gaining 20 pounds last month. He also feels like his abdomen has been edematous. He currently takes furosemide 20 mg once per day for the patient. Symptoms are moderate.He reports no fevers, no wheezing. Patient was 77% on room air prior to having 2 L nasal cannula applied.  Past Medical History  Diagnosis Date  . Diabetes mellitus without complication   . Hypertension   . Anemia   . Cardiac insufficiency   . CHF (congestive heart failure)   . COPD (chronic obstructive pulmonary disease)     There are no active problems to display for this patient.   Past Surgical History  Procedure Laterality Date  . Back surgery    . Cholecystectomy      Current Outpatient Rx  Name  Route  Sig  Dispense  Refill  . brimonidine (ALPHAGAN) 0.15 % ophthalmic solution      1 drop 2 (two) times daily.          . CRESTOR 10 MG tablet      10 mg daily.            Dispense as written.   . diphenhydramine-acetaminophen (TYLENOL PM) 25-500 MG TABS   Oral   Take 1 tablet by mouth at bedtime as needed.         . furosemide (LASIX) 20 MG tablet   Oral   Take 20 mg by mouth daily as needed.          . gabapentin (NEURONTIN) 300 MG capsule   Oral   Take 300 mg by mouth at bedtime.         .  hydrochlorothiazide (HYDRODIURIL) 25 MG tablet   Oral   Take 25 mg by mouth daily.          . insulin glargine (LANTUS) 100 UNIT/ML injection   Subcutaneous   Inject 30 Units into the skin daily. 28 units after breakfast and 10 units after supper         . iron polysaccharides (NIFEREX) 150 MG capsule   Oral   Take 150 mg by mouth 2 (two) times daily.         Marland Kitchen levothyroxine (SYNTHROID, LEVOTHROID) 25 MCG tablet   Oral   Take 25 mcg by mouth daily before breakfast.          . lisinopril (PRINIVIL,ZESTRIL) 10 MG tablet   Oral   Take 10 mg by mouth daily. TAKE ONE TABLET BY MOUTH EVERY DAY         . loratadine (CLARITIN) 10 MG tablet   Oral   Take 10 mg by mouth daily.         . metoprolol tartrate (LOPRESSOR) 25 MG tablet   Oral   Take 25 mg by mouth daily.          Marland Kitchen  Multiple Vitamin (MULTI-VITAMINS) TABS   Oral   Take 1 tablet by mouth daily.          . nitroGLYCERIN (NITROSTAT) 0.4 MG SL tablet   Sublingual   Place under the tongue.         . Omega-3 Fatty Acids (FISH OIL BURP-LESS) 500 MG CAPS   Oral   Take 1,500 mg by mouth daily.         Marland Kitchen omeprazole (PRILOSEC) 20 MG capsule   Oral   Take 20 mg by mouth daily.          . Probiotic Product (ALIGN) 4 MG CAPS   Oral   Take 4 mg by mouth daily.         . rosuvastatin (CRESTOR) 10 MG tablet   Oral   Take 10 mg by mouth daily. TAKE 1 TABLET DAILY AS DIRECTED         . tamsulosin (FLOMAX) 0.4 MG CAPS capsule   Oral   Take 0.4 mg by mouth daily.          Marland Kitchen azithromycin (ZITHROMAX) 250 MG tablet                 Allergies Amitriptyline; Amlodipine; Metoprolol; Rosiglitazone; and Simvastatin  History reviewed. No pertinent family history.  Social History History  Substance Use Topics  . Smoking status: Never Smoker   . Smokeless tobacco: Not on file  . Alcohol Use: No    Review of Systems  Constitutional: Negative for fever. Eyes: Negative for visual changes. ENT:  Negative for sore throat. Cardiovascular: Negative for chest pain. Respiratory: Negative for shortness of breath. Gastrointestinal: Negative for abdominal pain, vomiting and diarrhea. Genitourinary: Negative for dysuria. Musculoskeletal: Negative for back pain. Skin: Negative for rash. Neurological: Negative for headaches, focal weakness or numbness.  ____________________________________________   PHYSICAL EXAM:  VITAL SIGNS: ED Triage Vitals  Enc Vitals Group     BP 06/05/2015 1640 122/55 mmHg     Pulse Rate 05/21/2015 1640 89     Resp 05/26/2015 1640 16     Temp 06/01/2015 1640 98.6 F (37 C)     Temp Source 06/04/2015 1640 Oral     SpO2 05/19/2015 1638 77 %     Weight 05/25/2015 1640 206 lb (93.441 kg)     Height 05/15/2015 1640 5\' 4"  (1.626 m)     Head Cir --      Peak Flow --      Pain Score --      Pain Loc --      Pain Edu? --      Excl. in GC? --      Constitutional: Alert and oriented. Well appearing and in no distress. Eyes: Conjunctivae are normal. PERRL. Normal extraocular movements. ENT   Head: Normocephalic and atraumatic.   Nose: No congestion/rhinnorhea.   Mouth/Throat: Mucous membranes are moist.   Neck: No stridor. Cardiovascular: Normal rate, regular rhythm.  No murmurs, rubs, or gallops. Respiratory: Normal respiratory effort without tachypnea nor retractions. Breath sounds are clear and equal bilaterally. No wheezes/rales/rhonchi. Mildly decreased air movement throughout Gastrointestinal: Soft. No distention, no guarding, no rebound. Nontender  Genitourinary/rectal: Deferred Musculoskeletal: Nontender with normal range of motion in all extremities. No joint effusions. 3 or 4+ lower extremity pitting edema bilateral lower extremity is up to the thighs and into the abdomen. Neurologic:  Normal speech and language. No gross focal neurologic deficits are appreciated. Skin:  Skin is warm, dry and intact. No rash  noted. Psychiatric: Mood and affect are  normal. Speech and behavior are normal. Patient exhibits appropriate insight and judgment.  ____________________________________________   EKG  I, Governor Rooks, MD, the attending physician have personally viewed and interpreted this ECG.   92 bpm. Nonspecific intraventricular conduction delay. Normal axis. Nonspecific T wave. ____________________________________________  LABS (pertinent positives/negatives)  Sodium 139, chloride 92, glucose 136, BUN 32, creatinine 2.03 Up with the count 4.4 and hemoglobin 10.5 Troponin 0.03 BNP 568  ____________________________________________  RADIOLOGY Radiologist results reviewed  Chest x-ray: Two-view: Congestive heart failure with bilateral pleural effusions left greater than right __________________________________________  PROCEDURES  Procedure(s) performed: None Critical Care performed: None  ____________________________________________   ED COURSE / ASSESSMENT AND PLAN  Pertinent labs & imaging results that were available during my care of the patient were reviewed by me and considered in my medical decision making (see chart for details).  Patient is clinically in acute congestive heart failure exacerbation with lower shotty edema, hypoxia, chest x-ray findings of pulmonary edema, and elevated BNP. He will be admitted to the hospitalist service for continued diuresis.    ___________________________________________   FINAL CLINICAL IMPRESSION(S) / ED DIAGNOSES   Final diagnoses:  Congestive heart failure, unspecified congestive heart failure chronicity, unspecified congestive heart failure type      Governor Rooks, MD 06/09/2015 1821

## 2015-05-20 NOTE — ED Notes (Signed)
Troponin drawn from IV site and sent to lab

## 2015-05-20 NOTE — ED Notes (Signed)
Multiple areas of abrasions especially to right forehead from recent fall.

## 2015-05-21 ENCOUNTER — Inpatient Hospital Stay: Payer: Medicare Other

## 2015-05-21 ENCOUNTER — Inpatient Hospital Stay
Admit: 2015-05-21 | Discharge: 2015-05-21 | Disposition: A | Discharge status: Home / Self Care | Payer: Medicare Other | Attending: Internal Medicine | Admitting: Internal Medicine

## 2015-05-21 LAB — CBC
HCT: 33.8 % — ABNORMAL LOW (ref 40.0–52.0)
Hemoglobin: 11 g/dL — ABNORMAL LOW (ref 13.0–18.0)
MCH: 30.7 pg (ref 26.0–34.0)
MCHC: 32.5 g/dL (ref 32.0–36.0)
MCV: 94.5 fL (ref 80.0–100.0)
PLATELETS: 101 10*3/uL — AB (ref 150–440)
RBC: 3.58 MIL/uL — AB (ref 4.40–5.90)
RDW: 17.6 % — ABNORMAL HIGH (ref 11.5–14.5)
WBC: 6.1 10*3/uL (ref 3.8–10.6)

## 2015-05-21 LAB — GLUCOSE, CAPILLARY
GLUCOSE-CAPILLARY: 100 mg/dL — AB (ref 65–99)
GLUCOSE-CAPILLARY: 146 mg/dL — AB (ref 65–99)
Glucose-Capillary: 81 mg/dL (ref 65–99)
Glucose-Capillary: 94 mg/dL (ref 65–99)
Glucose-Capillary: 88 mg/dL (ref 65–99)
Glucose-Capillary: 95 mg/dL (ref 65–99)

## 2015-05-21 LAB — BLOOD GAS, ARTERIAL
Acid-Base Excess: 5.6 mmol/L — ABNORMAL HIGH (ref 0.0–3.0)
Allens test (pass/fail): POSITIVE — AB
Bicarbonate: 33.4 mEq/L — ABNORMAL HIGH (ref 21.0–28.0)
DELIVERY SYSTEMS: POSITIVE
FIO2: 0.25 %
Inspiratory PAP: 6
O2 Saturation: 85.5 %
PO2 ART: 54 mmHg — AB (ref 83.0–108.0)
Patient temperature: 37
pCO2 arterial: 62 mmHg — ABNORMAL HIGH (ref 32.0–48.0)
pH, Arterial: 7.34 — ABNORMAL LOW (ref 7.350–7.450)

## 2015-05-21 LAB — COMPREHENSIVE METABOLIC PANEL
ALK PHOS: 124 U/L (ref 38–126)
ALT: 26 U/L (ref 17–63)
AST: 41 U/L (ref 15–41)
Albumin: 3 g/dL — ABNORMAL LOW (ref 3.5–5.0)
Anion gap: 6 (ref 5–15)
BILIRUBIN TOTAL: 0.3 mg/dL (ref 0.3–1.2)
BUN: 34 mg/dL — ABNORMAL HIGH (ref 6–20)
CO2: 34 mmol/L — ABNORMAL HIGH (ref 22–32)
CREATININE: 2.09 mg/dL — AB (ref 0.61–1.24)
Calcium: 8.3 mg/dL — ABNORMAL LOW (ref 8.9–10.3)
Chloride: 91 mmol/L — ABNORMAL LOW (ref 101–111)
GFR calc Af Amer: 32 mL/min — ABNORMAL LOW (ref >60)
GFR calc non Af Amer: 28 mL/min — ABNORMAL LOW (ref >60)
Glucose, Bld: 94 mg/dL (ref 65–99)
Potassium: 5 mmol/L (ref 3.5–5.1)
Sodium: 131 mmol/L — ABNORMAL LOW (ref 135–145)
Total Protein: 6.2 g/dL — ABNORMAL LOW (ref 6.5–8.1)

## 2015-05-21 LAB — AMMONIA: AMMONIA: 21 umol/L (ref 9–35)

## 2015-05-21 MED ORDER — METHYLPREDNISOLONE SODIUM SUCC 125 MG IJ SOLR
INTRAMUSCULAR | Status: AC
  Filled 2015-05-21: qty 2

## 2015-05-21 MED ORDER — METHYLPREDNISOLONE SODIUM SUCC 125 MG IJ SOLR
60.0000 mg | INTRAMUSCULAR | Status: DC
  Administered 2015-05-21 – 2015-05-22 (×2): 60 mg via INTRAVENOUS
  Filled 2015-05-21: qty 2

## 2015-05-21 MED ORDER — INSULIN GLARGINE 100 UNIT/ML ~~LOC~~ SOLN
10.0000 [IU] | Freq: Once | SUBCUTANEOUS | Status: DC
  Filled 2015-05-21: qty 0.1

## 2015-05-21 MED ORDER — IPRATROPIUM-ALBUTEROL 0.5-2.5 (3) MG/3ML IN SOLN: 3.0000 mL | RESPIRATORY_TRACT | Status: DC | PRN

## 2015-05-21 MED ORDER — ENSURE ENLIVE PO LIQD
237.0000 mL | Freq: Three times a day (TID) | ORAL | Status: DC
  Administered 2015-05-21 – 2015-05-22 (×2): 237 mL via ORAL

## 2015-05-21 MED ORDER — FUROSEMIDE 10 MG/ML IJ SOLN
40.0000 mg | Freq: Three times a day (TID) | INTRAMUSCULAR | Status: DC
  Administered 2015-05-21 – 2015-05-22 (×4): 40 mg via INTRAVENOUS
  Filled 2015-05-21 (×3): qty 4

## 2015-05-21 NOTE — Progress Notes (Signed)
Phlebotomist was drawing blood. Notified me that the patient was shaking and wanted to see me. Went in to see patient. Patient upper and lower extremities shaking. No shaking present in prior assessment. Pt weaker than prior assessment. 2nd nurse called to assess patient. Blood glucose level checked.Results 81.  Pt wanted to use BSC. Pt unable to stand without buckling. Lift used for transfer back to bed. MD notified and acknowledged.

## 2015-05-21 NOTE — Progress Notes (Signed)
RN rounded on patient at [phone] and found patient unresponsive to physical or verbal stimulation. Complete set of vitals obtained and blood sugar (88), all WNL. Pt unable to express anything verbally; R pupil dilated at 97mm and non-reactive, L pupil constricted approx. 7mm with brisk reaction (pupil assessment largely consistent with AM assessment), pt unable to stick tongue out when asked, unable to grip RN or MD hands and unable to hold arms or legs up, they fall to gravity. Pt able to shake head "yes" or "no" and shook head "yes" when asked if he was having numbness and tingling in extremities. Lung sounds auscultated and expiratory wheezes noted. Pt is now awake, opening eyes, still unable to answer questions, very restless in bed. Dr. Elpidio Anis paged at 0915 about neuro status change, verbal order for STAT CT received and Dr. York Spaniel he will be up in room asap. Per Dr. Elpidio Anis, hold AM PO meds. Dr Elpidio Anis to put in further stat orders.

## 2015-05-21 NOTE — Progress Notes (Signed)
*  PRELIMINARY RESULTS* Echocardiogram 2D Echocardiogram has been performed.  Georgann Housekeeper Hege 05/21/2015, 2:33 PM

## 2015-05-21 NOTE — Progress Notes (Signed)
Initial Nutrition Assessment     INTERVENTION:    Meals and Snacks: Cater to patient preferences Medical Food Supplement Therapy: recommend sending Ensure Enlive po TID, each supplement provides 350 kcal and 20 grams of protein    NUTRITION DIAGNOSIS:  Inadequate oral intake related to acute illness as evidenced by meal completion < 25%.    GOAL:  Patient will meet greater than or equal to 90% of their needs    MONITOR:   (Energy Intake, Pulmonary, Anthropometrics, Electrolyte/Renal Profile, Digestive System, Glucose Profile)  REASON FOR ASSESSMENT:   (Diagnosis: CHF)    ASSESSMENT:  Pt admitted with acute respiratory failure due to CHF, COPD exacerbation; currently on Bipap, being transferred to CCU  Past Medical History  Diagnosis Date  . Diabetes mellitus without complication   . Hypertension   . Anemia   . Cardiac insufficiency   . CHF (congestive heart failure)   . COPD (chronic obstructive pulmonary disease)    Diet Order: Heart Healthy  Energy Intake: ate 0% of breakfast this AM  Digestive system: +BM 6/8  Electrolyte and Renal Profile:  Recent Labs Lab 2015-06-12 1650 05/21/15 0554  BUN 32* 34*  CREATININE 2.03* 2.09*  NA 129* 131*  K 4.8 5.0   Glucose Profile:   Recent Labs  05/21/15 0610 05/21/15 0804 05/21/15 0914  GLUCAP 81 94 88   Nutritional Anemia Profile:  CBC Latest Ref Rng 05/21/2015 06-12-2015 04/13/2015  WBC 3.8 - 10.6 K/uL 6.1 4.4 4.2  Hemoglobin 13.0 - 18.0 g/dL 11.0(L) 10.5(L) 12.5(L)  Hematocrit 40.0 - 52.0 % 33.8(L) 33.3(L) 39.0(L)  Platelets 150 - 440 K/uL 101(L) 76(L) 83(L)    Meds: aspart, lantus, solumedrol, lasix, solumedrol   Unable to complete Nutrition-Focused physical exam at this time. On Bipap, being transferred to CCU   Height:  Ht Readings from Last 1 Encounters:  2015/06/12 5\' 4"  (1.626 m)   Weight:  Wt Readings from Last 1 Encounters:  05/21/15 197 lb 14.4 oz (89.767 kg)   Ideal Body Weight:  59 kg  Filed Weights   12-Jun-2015 1640 June 12, 2015 2053 05/21/15 0503  Weight: 206 lb (93.441 kg) 199 lb 4.8 oz (90.402 kg) 197 lb 14.4 oz (89.767 kg)     Wt Readings from Last 10 Encounters:  05/21/15 197 lb 14.4 oz (89.767 kg)  04/13/15 190 lb (86.183 kg)    BMI:  Body mass index is 33.95 kg/(m^2).  Estimated Nutritional Needs:  Kcal:  1703-2013 kcals (BEE 1191, 1.3 AF, 1.1-1.3 IF) using IBW 59 kg  Protein:  65-77 g (1.1-1.3 g/kg) using IBW 59 kg  Fluid:  1475-1770 mL (25-30 ml/kg) using IBW 59 kg     Intake/Output Summary (Last 24 hours) at 05/21/15 1450 Last data filed at 05/21/15 1116  Gross per 24 hour  Intake      0 ml  Output    975 ml  Net   -975 ml   MODERATE Care Level  Romelle Starcher MS, RD, LDN [phone] Pager

## 2015-05-21 NOTE — Progress Notes (Signed)
[facility] [place] [facility]   PATIENT NAME: Michael Cisneros    MR#:  [id number]  DATE OF BIRTH:  08/31/1929  SUBJECTIVE:  CHIEF COMPLAINT:   Chief Complaint  Patient presents with  . Shortness of Breath   Admitted for SOB. Paged by nurse with acutely worsening mental status. Non verbal. ON arrival patient pulling on sheets but moving all extremities. Follows some commands. Some 1-2 word answers. REVIEW OF SYSTEMS:    ROS  UNobtainable due to encephalopathy  DRUG ALLERGIES:   Allergies  Allergen Reactions  . Amitriptyline Other (See Comments)    Reaction:  Unknown   . Amlodipine Other (See Comments)    Reaction:  Unknown   . Beta Adrenergic Blockers Other (See Comments)    Reaction:  Unknown   . Levofloxacin Other (See Comments)    Reaction:  Unknown   . Rosiglitazone Other (See Comments)    Reaction:  Unknown   . Simvastatin Other (See Comments)    Reaction:  Unknown     VITALS:  Blood pressure 129/52, pulse 69, temperature 98.1 F (36.7 C), temperature source Oral, resp. rate 18, height 5\' 4"  (1.626 m), weight 89.767 kg (197 lb 14.4 oz), SpO2 100 %.  PHYSICAL EXAMINATION:   Physical Exam  GENERAL:  81 y.o.-year-old patient lying in the bed , restless EYES: Pupils unequal R>L HEENT: Head atraumatic, normocephalic. Oropharynx and nasopharynx clear.  NECK:  Supple, no jugular venous distention. No thyroid enlargement, no tenderness.  LUNGS: Decreased air entry bases with crackles and weezing CARDIOVASCULAR: S1, S2 normal. No murmurs, rubs, or gallops.  ABDOMEN: Soft, nontender, nondistended. Bowel sounds present. No organomegaly or mass.  EXTREMITIES: No cyanosis, clubbing or edema b/l.    NEUROLOGIC: Moves all 4 extremities. PSYCHIATRIC: The patient is drowzy and confused SKIN: No obvious rash, lesion, or ulcer.    LABORATORY PANEL:   CBC  Recent Labs Lab 05/21/15 0554  WBC 6.1  HGB 11.0*  HCT 33.8*  PLT 101*    ------------------------------------------------------------------------------------------------------------------  Chemistries   Recent Labs Lab 05/21/15 0554  NA 131*  K 5.0  CL 91*  CO2 34*  GLUCOSE 94  BUN 34*  CREATININE 2.09*  CALCIUM 8.3*  AST 41  ALT 26  ALKPHOS 124  BILITOT 0.3   ------------------------------------------------------------------------------------------------------------------  Cardiac Enzymes  Recent Labs Lab Jun 03, 2015 1958  TROPONINI <0.03   ------------------------------------------------------------------------------------------------------------------  RADIOLOGY:  Dg Chest 2 View  Jun 03, 2015   CLINICAL DATA:  CHF and shortness of breath.  EXAM: CHEST - 2 VIEW  COMPARISON:  04/13/2015  FINDINGS: There is evidence of mild congestive heart failure and bilateral pleural effusions with a moderate left and small right effusion. Associated bibasilar atelectasis. The heart size is stable.  IMPRESSION: Congestive heart failure with bilateral pleural effusions, left greater than right.   Electronically Signed   By: Irish Lack M.D.   On: 03-Jun-2015 18:00     ASSESSMENT AND PLAN:   2 m with HTN, CHF, DM, CKD3 here with SOB  # Acute encephalopathy Underlying dementia He does have unequal pupils on exam R>L Tried calling wife and son but no response. Will get STAT CT head, ABG, Ammonia  # Acute on chronic diastolic chf Increase lasix to 40mg  IV TID. Has pulm edema and b/l pleural effusions on CXR Monitor K and GFR. I/O  # Acute respiratory hypoxic failure secondary to acute exacerbation of CHF Sats 70% in ED on RA. Continue O2  # Acute COPD exacerbation  Start steroids and Nebs.  # IDDM SSI, ADA  # hypertension Continue home medications ACE inhibitor, beta blocker and Lasix  # hypothyroidism Continue Synthroid  # CKD3 Monitor as he is on diuresis   Critically ill with acute encepahlopathy. Concern for CVA  All the  records are reviewed and case discussed with Care Management/Social Workerr. Management plans discussed with the patient, family and they are in agreement.  CODE STATUS: FULL CODE  DVT Prophylaxis: SCDs  TOTAL CC TIME TAKING CARE OF THIS PATIENT: 40 minutes.    Milagros Loll R M.D on 05/21/2015 at 9:38 AM  Between 7am to 6pm - Pager - [phone]  After 6pm go to [email or web address] - password [email or web address] [facility]  [place] [facility]  Office  [phone]  CC: Primary care physician; BABAOFF, Lavada Mesi, MD

## 2015-05-21 NOTE — Progress Notes (Signed)
Report called to [facility], ICU RN. Pt. Resting quietly in bed, VSS, family at bedside with pt belongings. RT not yet arrived to set up on BiPAP, will go ahead and transfer to ICU 13.

## 2015-05-21 NOTE — Progress Notes (Addendum)
Wife and Daughter in law here now. Discussed with them and he does have chronic vision loss on right. No history of dementia. Does have NASH and follows with Dr. Mechele Collin.  Discussed in detail and answered all questions. Will follow on CT and labs.  10.30 AM  ABG showed Ph 7.19 with Pco2 - 90 Transferred to ICU and started on Bipap. Discussed with daughter in detail.  12.30 PM  Rexamined patient. More awake on Bipap. Follows instructions S1, S2 Crackles in lungs. Edema  Family at bedside.  Repeat ABG showed Ph 7.25 and PCo2 77. Improving but still critically ill. Conitnue Bipap.  Additional CC time spent 40 minutes

## 2015-05-21 NOTE — Progress Notes (Signed)
Respiratory therapist notified RN of critical ABG. Dr. Elpidio Anis paged and made aware of results. Pt to transfer to ICU, RT bringing BiPAP to set up with pt.

## 2015-05-21 NOTE — Progress Notes (Signed)
Talked with dr Sheryle Hail about sugar pt not eating was to get 30 lantus  chaNGED GAVE PT 10 WAS given Shara Blazing, RN

## 2015-05-21 NOTE — Consult Note (Signed)
Name: DELAN KSIAZEK MRN: [id number] DOB: 08-01-1929    LOS: 1  Referring Provider:  Hortense Ramal, MD Reason for Referral:  Acute Respiratory Failure  PULMONARY / CRITICAL CARE MEDICINE  HPI:  Michael Cisneros is a 81 y.o. male with a known history of insulin requiring diabetes mellitus, congestive heart failure, hypertension and COPD. Patient was admitted with increased shortness of breath and was noted to be significantly hypoxic on admission. His initial saturations noted in the ED were 77%, and he was started on oxygen therapy. Patient has started to have some decline in his status and an ABG drawn this morning shows that his pH had gone to 7.19 with a pCO2 of 90. He was started on BIPAP and remains currently on BIPAP  Past Medical History  Diagnosis Date  . Diabetes mellitus without complication   . Hypertension   . Anemia   . Cardiac insufficiency   . CHF (congestive heart failure)   . COPD (chronic obstructive pulmonary disease)    Past Surgical History  Procedure Laterality Date  . Back surgery    . Cholecystectomy     Prior to Admission medications   Medication Sig Start Date End Date Taking? Authorizing Provider  brimonidine (ALPHAGAN) 0.15 % ophthalmic solution Place 1 drop into both eyes 2 (two) times daily.   Yes Historical Provider, MD  diphenhydramine-acetaminophen (TYLENOL PM) 25-500 MG TABS Take 1-2 tablets by mouth at bedtime as needed (for sleep).    Yes Historical Provider, MD  furosemide (LASIX) 20 MG tablet Take 20 mg by mouth daily as needed for edema.   Yes Historical Provider, MD  gabapentin (NEURONTIN) 300 MG capsule Take 300 mg by mouth at bedtime.   Yes Historical Provider, MD  hydrochlorothiazide (HYDRODIURIL) 25 MG tablet Take 25 mg by mouth daily.   Yes Historical Provider, MD  insulin glargine (LANTUS) 100 UNIT/ML injection Inject 30 Units into the skin daily.   Yes Historical Provider, MD  iron polysaccharides (NIFEREX) 150 MG capsule Take 150 mg by mouth  2 (two) times daily.   Yes Historical Provider, MD  levothyroxine (SYNTHROID, LEVOTHROID) 25 MCG tablet Take 25 mcg by mouth daily.   Yes Historical Provider, MD  lisinopril (PRINIVIL,ZESTRIL) 10 MG tablet Take 10 mg by mouth daily.   Yes Historical Provider, MD  loratadine (CLARITIN) 10 MG tablet Take 10 mg by mouth daily.   Yes Historical Provider, MD  metoprolol tartrate (LOPRESSOR) 25 MG tablet Take 25 mg by mouth daily.   Yes Historical Provider, MD  Multiple Vitamins-Minerals (MULTIVITAMIN PO) Take 1 tablet by mouth daily.   Yes Historical Provider, MD  nitroGLYCERIN (NITROSTAT) 0.4 MG SL tablet Place 0.4 mg under the tongue every 5 (five) minutes as needed for chest pain.   Yes Historical Provider, MD  Omega-3 Fatty Acids (FISH OIL PO) Take 1 capsule by mouth daily.   Yes Historical Provider, MD  omeprazole (PRILOSEC) 20 MG capsule Take 20 mg by mouth daily.   Yes Historical Provider, MD  Probiotic Product (ALIGN) 4 MG CAPS Take 1 capsule by mouth daily.   Yes Historical Provider, MD  rosuvastatin (CRESTOR) 10 MG tablet Take 10 mg by mouth daily.   Yes Historical Provider, MD  tamsulosin (FLOMAX) 0.4 MG CAPS capsule Take 0.4 mg by mouth daily.   Yes Historical Provider, MD   Allergies Allergies  Allergen Reactions  . Amitriptyline Other (See Comments)    Reaction:  Unknown   . Amlodipine Other (See Comments)  Reaction:  Unknown   . Beta Adrenergic Blockers Other (See Comments)    Reaction:  Unknown   . Levofloxacin Other (See Comments)    Reaction:  Unknown   . Rosiglitazone Other (See Comments)    Reaction:  Unknown   . Simvastatin Other (See Comments)    Reaction:  Unknown     Family History History reviewed. No pertinent family history. Social History  reports that he has never smoked. He does not have any smokeless tobacco history on file. He reports that he does not drink alcohol. His drug history is not on file.  Review Of Systems:   CONSTITUTIONAL: No fever,  +weakness.  EYES: No double vision.  RESPIRATORY: +shortness of breath CARDIOVASCULAR: +weight gain No chest pain ++edema GASTROINTESTINAL: No nausea, vomiting, diarrhea  GENITOURINARY: No dysuria, hematuria HEMATOLOGY: No easy bruising or bleeding SKIN: No rash or lesion. MUSCULOSKELETAL: No joint pain or arthritis. NEUROLOGIC: No tingling, numbness, weakness.  PSYCHIATRY: No anxiety or depression.      Vital Signs: Temp:  [97.7 F (36.5 C)-98.6 F (37 C)] 98.6 F (37 C) (06/10 1116) Pulse Rate:  [69-90] 87 (06/10 1116) Resp:  [13-35] 35 (06/10 1116) BP: (122-160)/(52-106) 129/106 mmHg (06/10 1116) SpO2:  [74 %-100 %] 91 % (06/10 1116) FiO2 (%):  [25 %] 25 % (06/10 1116) Weight:  [89.767 kg (197 lb 14.4 oz)-93.441 kg (206 lb)] 89.767 kg (197 lb 14.4 oz) (06/10 0503)  Physical Examination: General:  Comfortable no distress at this time Neuro:  Awake moves all for extremities HEENT:  Has BIPAP mask on at this time Neck:  supple Cardiovascular: s1s2 nl RRR Lungs:  Clear no ronchi or rales noted Abdomen:  Soft non-tender but distended with probable ascites Musculoskeletal:  Normal tone Skin:  No rash noted grossly  Active Problems:   Acute exacerbation of CHF (congestive heart failure)   ASSESSMENT AND PLAN  PULMONARY  Recent Labs Lab 05/21/15 1025  PHART 7.19*  PCO2ART 90*  PO2ART 58*  HCO3 34.4*  O2SAT 82.3   Ventilator Settings: Vent Mode:  [-]  FiO2 (%):  [25 %] 25 %   A: Acute respiratory failure with hypercapnia P:   -currently on BIPAP -will repeat ABG titrate fio2 as needed -currently patient is a full code  CARDIOVASCULAR  Recent Labs Lab 05/13/2015 1650 05/13/2015 1958  TROPONINI 0.03 <0.03    A: Acute on Chronic Diastolic Heart failure P:  -cardiology consult ordered  RENAL  Recent Labs Lab 06/07/2015 1650 05/21/15 0554  NA 129* 131*  K 4.8 5.0  CL 92* 91*  CO2 31 34*  BUN 32* 34*  CREATININE 2.03* 2.09*  CALCIUM 8.1*  8.3*   Intake/Output      06/09 0701 - 06/10 0700 06/10 0701 - 06/11 0700   P.O.  0   Total Intake(mL/kg)  0 (0)   Urine (mL/kg/hr) 625    Total Output 625     Net -625 0        Urine Occurrence 1 x      A:  CKD stage III Hyponatremia P:   -sodium improving -will monitor labs and IOs  GASTROINTESTINAL  Recent Labs Lab 06/10/2015 1650 05/21/15 0554  AST 43* 41  ALT 26 26  ALKPHOS 118 124  BILITOT 0.5 0.3  PROT 5.8* 6.2*  ALBUMIN 2.9* 3.0*    A: history of NASH P:   Followed by GI as an outpatient -will get a US of the abdomen to assess for ascites  HEMATOLOGIC  Recent Labs Lab 05/25/2015 1650 05/21/15 0554  HGB 10.5* 11.0*  HCT 33.3* 33.8*  PLT 76* 101*   A:  Anemia P:  -will monitor Hgb  INFECTIOUS  Recent Labs Lab 06/02/2015 1650 05/21/15 0554  WBC 4.4 6.1    A:  No fevers noted no elevation in WBC P:   -Will monitor  ENDOCRINE  Recent Labs Lab 05/21/15 0610 05/21/15 0804 05/21/15 0914  GLUCAP 81 94 88   A:  On solumedrol P:   -will monitor FSBS and provide SSI coverage as needed  NEUROLOGIC  A:  Altered Mental Status P:   -possibly due to acute decline in respiratory status -will continue with BIPAP -monitor labs and ABG  I have personally obtained a history, examined the patient, evaluated laboratory and imaging results, formulated the assessment and plan and placed orders.  The Patient requires high complexity decision making for assessment and support, frequent evaluation and titration of therapies, application of advanced monitoring technologies and extensive interpretation of multiple databases.   Critical Care Time:  Yevonne Pax, MD [facility] Pulmonary Critical Care Medicine Sleep Medicine

## 2015-05-21 NOTE — Care Management (Signed)
Patient presents from with shortness of breath.  He does have dx of COPD but is felt shortness of breath is due to exac of CHF.  Patient  Experienced significant episode of altered mental status this morning and had head CT which was negative.  ABGs show elevated PCO2 and patient transferred to ICU and placed on bipap.  Pulmonary consult in progress

## 2015-05-21 NOTE — Consult Note (Signed)
Reason for Consult: Respiratory failure shortness of breath congestive heart failure Referring Physician: Dr. Sudini hospitalist  Michael Cisneros is an 81 y.o. male.  HPI: 81-year-old Michael Cisneros male history of diabetes congestive heart failure and COPD hypertension leg edema recently had multiple falls at home but shortness of breath went to his primary physician with generalized weakness and lower extremity edema. Patient may have gained about 20 pounds recently over the last month or so and he was referred to the emergency room from his doctor's office. Pulse ox was in the 70s he was placed on 2 L of oxygen which improves his sats to 96% chest x-ray revealed pulmonary congestion he was treated with diuretics. Patient had significant worsening mental status and short of breath and was placed on BiPAP and transferred to intensive care unit. No fever chills or sweats persistent leg swelling no sputum production and denies any chest pain patient usually sees Dr. Kowalski has an appointment in the next few weeks. Patient was on BiPAP to the history was obtained from his daughter-in-law who is visiting from [place] and his wife. Patient's awake and alert. Currently on BiPAP and is unable to give much history.  Past Medical History  Diagnosis Date  . Diabetes mellitus without complication   . Hypertension   . Anemia   . Cardiac insufficiency   . CHF (congestive heart failure)   . COPD (chronic obstructive pulmonary disease)     Past Surgical History  Procedure Laterality Date  . Back surgery    . Cholecystectomy      History reviewed. No pertinent family history.  Social History:  reports that he has never smoked. He does not have any smokeless tobacco history on file. He reports that he does not drink alcohol. His drug history is not on file.  Allergies:  Allergies  Allergen Reactions  . Amitriptyline Other (See Comments)    Reaction:  Unknown   . Amlodipine Other (See Comments)    Reaction:   Unknown   . Beta Adrenergic Blockers Other (See Comments)    Reaction:  Unknown   . Levofloxacin Other (See Comments)    Reaction:  Unknown   . Rosiglitazone Other (See Comments)    Reaction:  Unknown   . Simvastatin Other (See Comments)    Reaction:  Unknown     Medications:  Prior to Admission:  Prescriptions prior to admission  Medication Sig Dispense Refill Last Dose  . brimonidine (ALPHAGAN) 0.15 % ophthalmic solution Place 1 drop into both eyes 2 (two) times daily.   unknown at unknown  . diphenhydramine-acetaminophen (TYLENOL PM) 25-500 MG TABS Take 1-2 tablets by mouth at bedtime as needed (for sleep).    PRN at PRN  . furosemide (LASIX) 20 MG tablet Take 20 mg by mouth daily as needed for edema.   PRN at PRN  . gabapentin (NEURONTIN) 300 MG capsule Take 300 mg by mouth at bedtime.   unknown at unknown  . hydrochlorothiazide (HYDRODIURIL) 25 MG tablet Take 25 mg by mouth daily.   unknown at unknown  . insulin glargine (LANTUS) 100 UNIT/ML injection Inject 30 Units into the skin daily.   unknown at unknown  . iron polysaccharides (NIFEREX) 150 MG capsule Take 150 mg by mouth 2 (two) times daily.   unknown at unknown  . levothyroxine (SYNTHROID, LEVOTHROID) 25 MCG tablet Take 25 mcg by mouth daily.   unknown at unknown  . lisinopril (PRINIVIL,ZESTRIL) 10 MG tablet Take 10 mg by mouth daily.     unknown at unknown  . loratadine (CLARITIN) 10 MG tablet Take 10 mg by mouth daily.   unknown at unknown  . metoprolol tartrate (LOPRESSOR) 25 MG tablet Take 25 mg by mouth daily.   unknown at unknown  . Multiple Vitamins-Minerals (MULTIVITAMIN PO) Take 1 tablet by mouth daily.   unknown at unknown  . nitroGLYCERIN (NITROSTAT) 0.4 MG SL tablet Place 0.4 mg under the tongue every 5 (five) minutes as needed for chest pain.   PRN at PRN  . Omega-3 Fatty Acids (FISH OIL PO) Take 1 capsule by mouth daily.   unknown at unknown  . omeprazole (PRILOSEC) 20 MG capsule Take 20 mg by mouth daily.    unknown at unknown  . Probiotic Product (ALIGN) 4 MG CAPS Take 1 capsule by mouth daily.   unknown at unknown  . rosuvastatin (CRESTOR) 10 MG tablet Take 10 mg by mouth daily.   unknown at unknown  . tamsulosin (FLOMAX) 0.4 MG CAPS capsule Take 0.4 mg by mouth daily.   unknown at unknown    Results for orders placed or performed during the hospital encounter of 06/07/2015 (from the past 48 hour(s))  Comprehensive metabolic panel     Status: Abnormal   Collection Time: 06/08/2015  4:50 PM  Result Value Ref Range   Sodium 129 (L) 135 - 145 mmol/L   Potassium 4.8 3.5 - 5.1 mmol/L   Chloride 92 (L) 101 - 111 mmol/L   CO2 31 22 - 32 mmol/L   Glucose, Bld 136 (H) 65 - 99 mg/dL   BUN 32 (H) 6 - 20 mg/dL   Creatinine, Ser 2.03 (H) 0.61 - 1.24 mg/dL   Calcium 8.1 (L) 8.9 - 10.3 mg/dL   Total Protein 5.8 (L) 6.5 - 8.1 g/dL   Albumin 2.9 (L) 3.5 - 5.0 g/dL   AST 43 (H) 15 - 41 U/L   ALT 26 17 - 63 U/L   Alkaline Phosphatase 118 38 - 126 U/L   Total Bilirubin 0.5 0.3 - 1.2 mg/dL   GFR calc non Af Amer 29 (L) >60 mL/min   GFR calc Af Amer 33 (L) >60 mL/min    Comment: (NOTE) The eGFR has been calculated using the CKD EPI equation. This calculation has not been validated in all clinical situations. eGFR's persistently <60 mL/min signify possible Chronic Kidney Disease.    Anion gap 6 5 - 15  CBC with Differential     Status: Abnormal   Collection Time: 06/05/2015  4:50 PM  Result Value Ref Range   WBC 4.4 3.8 - 10.6 K/uL   RBC 3.53 (L) 4.40 - 5.90 MIL/uL   Hemoglobin 10.5 (L) 13.0 - 18.0 g/dL   HCT 33.3 (L) 40.0 - 52.0 %   MCV 94.2 80.0 - 100.0 fL   MCH 29.7 26.0 - 34.0 pg   MCHC 31.6 (L) 32.0 - 36.0 g/dL   RDW 17.9 (H) 11.5 - 14.5 %   Platelets 76 (L) 150 - 440 K/uL   Neutrophils Relative % 83% %   Neutro Abs 3.6 1.4 - 6.5 K/uL   Lymphocytes Relative 9% %   Lymphs Abs 0.4 (L) 1.0 - 3.6 K/uL   Monocytes Relative 7% %   Monocytes Absolute 0.3 0.2 - 1.0 K/uL   Eosinophils Relative 1% %    Eosinophils Absolute 0.0 0 - 0.7 K/uL   Basophils Relative 0% %   Basophils Absolute 0.0 0 - 0.1 K/uL  Troponin I     Status: None     Collection Time: 05/13/2015  4:50 PM  Result Value Ref Range   Troponin I 0.03 <0.031 ng/mL    Comment:        NO INDICATION OF MYOCARDIAL INJURY.   Brain natriuretic peptide     Status: Abnormal   Collection Time: 05/24/2015  4:50 PM  Result Value Ref Range   B Natriuretic Peptide 568.0 (H) 0.0 - 100.0 pg/mL  Troponin I (q 6hr x 3)     Status: None   Collection Time: 05/27/2015  7:58 PM  Result Value Ref Range   Troponin I <0.03 <0.031 ng/mL    Comment:        NO INDICATION OF MYOCARDIAL INJURY.   CBC     Status: Abnormal   Collection Time: 05/21/15  5:54 AM  Result Value Ref Range   WBC 6.1 3.8 - 10.6 K/uL   RBC 3.58 (L) 4.40 - 5.90 MIL/uL   Hemoglobin 11.0 (L) 13.0 - 18.0 g/dL   HCT 33.8 (L) 40.0 - 52.0 %   MCV 94.5 80.0 - 100.0 fL   MCH 30.7 26.0 - 34.0 pg   MCHC 32.5 32.0 - 36.0 g/dL   RDW 17.6 (H) 11.5 - 14.5 %   Platelets 101 (L) 150 - 440 K/uL  Comprehensive metabolic panel     Status: Abnormal   Collection Time: 05/21/15  5:54 AM  Result Value Ref Range   Sodium 131 (L) 135 - 145 mmol/L   Potassium 5.0 3.5 - 5.1 mmol/L   Chloride 91 (L) 101 - 111 mmol/L   CO2 34 (H) 22 - 32 mmol/L   Glucose, Bld 94 65 - 99 mg/dL   BUN 34 (H) 6 - 20 mg/dL   Creatinine, Ser 2.09 (H) 0.61 - 1.24 mg/dL   Calcium 8.3 (L) 8.9 - 10.3 mg/dL   Total Protein 6.2 (L) 6.5 - 8.1 g/dL   Albumin 3.0 (L) 3.5 - 5.0 g/dL   AST 41 15 - 41 U/L   ALT 26 17 - 63 U/L   Alkaline Phosphatase 124 38 - 126 U/L   Total Bilirubin 0.3 0.3 - 1.2 mg/dL   GFR calc non Af Amer 28 (L) >60 mL/min   GFR calc Af Amer 32 (L) >60 mL/min    Comment: (NOTE) The eGFR has been calculated using the CKD EPI equation. This calculation has not been validated in all clinical situations. eGFR's persistently <60 mL/min signify possible Chronic Kidney Disease.    Anion gap 6 5 - 15   Glucose, capillary     Status: None   Collection Time: 05/21/15  6:10 AM  Result Value Ref Range   Glucose-Capillary 81 65 - 99 mg/dL  Glucose, capillary     Status: None   Collection Time: 05/21/15  8:04 AM  Result Value Ref Range   Glucose-Capillary 94 65 - 99 mg/dL   Comment 1 Notify RN   Glucose, capillary     Status: None   Collection Time: 05/21/15  9:14 AM  Result Value Ref Range   Glucose-Capillary 88 65 - 99 mg/dL  Ammonia     Status: None   Collection Time: 05/21/15  9:47 AM  Result Value Ref Range   Ammonia 21 9 - 35 umol/L  Blood gas, arterial     Status: Abnormal   Collection Time: 05/21/15 10:25 AM  Result Value Ref Range   FIO2 0.28 %   Delivery systems NASAL CANNULA    pH, Arterial 7.19 (LL) 7.350 - 7.450     pCO2 arterial 90 (HH) 32.0 - 48.0 mmHg   pO2, Arterial 58 (L) 83.0 - 108.0 mmHg   Bicarbonate 34.4 (H) 21.0 - 28.0 mEq/L   Acid-Base Excess 3.2 (H) 0.0 - 3.0 mmol/L   O2 Saturation 82.3 %   Patient temperature 37.0    Collection site LEFT RADIAL    Drawn by [id number]    Sample type ARTERIAL DRAW    Allens test (pass/fail) POSITIVE (A) PASS  Blood gas, arterial     Status: Abnormal   Collection Time: 05/21/15 12:05 PM  Result Value Ref Range   FIO2 0.25 %   Delivery systems BILEVEL POSITIVE AIRWAY PRESSURE    Inspiratory PAP 16    Expiratory PAP 5    pH, Arterial 7.25 (L) 7.350 - 7.450   pCO2 arterial 77 (HH) 32.0 - 48.0 mmHg   pO2, Arterial 57 (L) 83.0 - 108.0 mmHg    Comment: CRITICAL VALUE NOTED.  VALUE IS CONSISTENT WITH PREVIOUSLY REPORTED AND CALLED VALUE. CRITICAL RESULT CALLED TO, READ BACK BY AND VERIFIED WITH:    Bicarbonate 33.8 (H) 21.0 - 28.0 mEq/L   Acid-Base Excess 4.0 (H) 0.0 - 3.0 mmol/L   O2 Saturation 84.1 %   Patient temperature 37.0    Collection site RIGHT RADIAL    Sample type ARTERIAL DRAW    Allens test (pass/fail) POSITIVE (A) PASS    Dg Chest 2 View  05/21/2015   CLINICAL DATA:  CHF and shortness of breath.  EXAM:  CHEST - 2 VIEW  COMPARISON:  04/13/2015  FINDINGS: There is evidence of mild congestive heart failure and bilateral pleural effusions with a moderate left and small right effusion. Associated bibasilar atelectasis. The heart size is stable.  IMPRESSION: Congestive heart failure with bilateral pleural effusions, left greater than right.   Electronically Signed   By: Glenn  Yamagata M.D.   On: 05/22/2015 18:00   Ct Head Wo Contrast  05/21/2015   CLINICAL DATA:  Acute altered mental status changes. Patient unresponsive 45 minutes ago.  EXAM: CT HEAD WITHOUT CONTRAST  TECHNIQUE: Contiguous axial images were obtained from the base of the skull through the vertex without intravenous contrast.  COMPARISON:  None.  FINDINGS: The ventricles are normal in size and configuration. No extra-axial fluid collections are identified. The gray-Brocious differentiation is normal. No CT findings for acute intracranial process such as hemorrhage or infarction. No mass lesions. The brainstem and cerebellum are grossly normal.  The bony structures are intact. The paranasal sinuses and mastoid air cells are clear. The globes are intact.  IMPRESSION: No acute intracranial findings or mass lesion.   Electronically Signed   By: P.  Gallerani M.D.   On: 05/21/2015 10:46    Review of Systems  Constitutional: Positive for malaise/fatigue.  HENT: Positive for congestion.   Eyes: Negative.   Respiratory: Positive for shortness of breath.   Cardiovascular: Positive for orthopnea, leg swelling and PND.  Gastrointestinal: Negative.   Genitourinary: Negative.   Musculoskeletal: Negative.   Skin: Negative.   Neurological: Positive for weakness.  Psychiatric/Behavioral: Negative.    Blood pressure 149/61, pulse 85, temperature 98.6 F (37 C), temperature source Axillary, resp. rate 20, height 5' 4" (1.626 m), weight 89.767 kg (197 lb 14.4 oz), SpO2 91 %. Physical Exam  Constitutional: He is oriented to person, place, and time. He  appears well-developed and well-nourished.  HENT:  Head: Normocephalic.  Eyes: Conjunctivae and EOM are normal. Pupils are equal, round, and reactive to light.  Neck: Normal   range of motion. Neck supple.  Cardiovascular: Exam reveals gallop.   Murmur heard. Respiratory: He is in respiratory distress. He has wheezes. He has rales.  GI: Soft. Bowel sounds are normal.  Musculoskeletal: Normal range of motion.  Neurological: He is alert and oriented to person, place, and time. He has normal reflexes.  Skin: Skin is warm and dry.    Assessment/Plan: Resp Failure acute on chronic Shortness of breath Congestive heart failure Hyperlipidemia Hypertension Diabetes COPD versus bronchitis Thyroid disease GERD Chronic renal insufficiency . PLAN Agree with ICU care Continue Bipap for resp Failure Supplimental 02 IV lasix therapy for CHF Agree with ECHO for CHF Continue insulin therapy for DM Continue thyroid meds ROMI MI for evaluation Agree with steroid therapy  Continue pulmonary in put Consider broad-spectrum antibiotics for COPD bronchitis symptoms Support stockings for edema DVT prophylaxis Do not recommend invasive procedure like a cardiac cath at this point Consider hydralazine therapy to help with heart failure in addition to Imdur Would avoid ACE inhibitor therapy because of renal insufficiency Recommend nephrology improve renal insufficiency Corrected electrolytes including hyponatremia Altered mental status probably related to hypercapnia continue BiPAP     CALLWOOD,DWAYNE D. 05/21/2015, 1:27 PM

## 2015-05-21 NOTE — Progress Notes (Signed)
   05/21/15 1235  Clinical Encounter Type  Visited With Patient and family together  Visit Type Initial  Consult/Referral To Chaplain  Spiritual Encounters  Spiritual Needs Emotional  Stress Factors  Patient Stress Factors None identified  Family Stress Factors None identified  Chaplain rounded in the unit and provided compassionate presence and support to patient and family as applicable. Chaplain Zaydn Gutridge A. Tamaira Ciriello Ext. [phone]

## 2015-05-22 ENCOUNTER — Inpatient Hospital Stay: Payer: Medicare Other

## 2015-05-22 LAB — BASIC METABOLIC PANEL
ANION GAP: 9 (ref 5–15)
BUN: 43 mg/dL — ABNORMAL HIGH (ref 6–20)
CALCIUM: 8.3 mg/dL — AB (ref 8.9–10.3)
CO2: 32 mmol/L (ref 22–32)
Chloride: 91 mmol/L — ABNORMAL LOW (ref 101–111)
Creatinine, Ser: 2.34 mg/dL — ABNORMAL HIGH (ref 0.61–1.24)
GFR calc Af Amer: 28 mL/min — ABNORMAL LOW (ref >60)
GFR, EST NON AFRICAN AMERICAN: 24 mL/min — AB (ref >60)
Glucose, Bld: 177 mg/dL — ABNORMAL HIGH (ref 65–99)
Potassium: 5.1 mmol/L (ref 3.5–5.1)
Sodium: 132 mmol/L — ABNORMAL LOW (ref 135–145)

## 2015-05-22 LAB — GLUCOSE, CAPILLARY
Glucose-Capillary: 154 mg/dL — ABNORMAL HIGH (ref 65–99)
Glucose-Capillary: 116 mg/dL — ABNORMAL HIGH (ref 65–99)

## 2015-05-22 LAB — MRSA PCR SCREENING: MRSA by PCR: NEGATIVE

## 2015-05-22 MED ORDER — MORPHINE SULFATE 2 MG/ML IJ SOLN
1.0000 mg | INTRAMUSCULAR | Status: DC | PRN
  Administered 2015-05-22 – 2015-05-23 (×4): 1 mg via INTRAVENOUS
  Filled 2015-05-22 (×4): qty 1

## 2015-05-22 MED ORDER — ACETAMINOPHEN 650 MG RE SUPP: 650.0000 mg | Freq: Four times a day (QID) | RECTAL | Status: DC | PRN

## 2015-05-22 MED ORDER — LORAZEPAM 2 MG/ML IJ SOLN
1.0000 mg | INTRAMUSCULAR | Status: DC | PRN
  Administered 2015-05-22 (×2): 1 mg via INTRAVENOUS
  Filled 2015-05-22 (×2): qty 1

## 2015-05-22 MED ORDER — CEFTRIAXONE SODIUM IN DEXTROSE 20 MG/ML IV SOLN
1.0000 g | INTRAVENOUS | Status: DC
  Filled 2015-05-22 (×2): qty 50

## 2015-05-22 MED ORDER — ONDANSETRON 4 MG PO TBDP
4.0000 mg | ORAL_TABLET | Freq: Four times a day (QID) | ORAL | Status: DC | PRN
  Filled 2015-05-22: qty 1

## 2015-05-22 MED ORDER — ONDANSETRON HCL 4 MG/2ML IJ SOLN: 4.0000 mg | Freq: Four times a day (QID) | INTRAMUSCULAR | Status: DC | PRN

## 2015-05-22 MED ORDER — LORAZEPAM 2 MG/ML PO CONC: 1.0000 mg | ORAL | Status: DC | PRN

## 2015-05-22 MED ORDER — LORAZEPAM 1 MG PO TABS: 1.0000 mg | ORAL_TABLET | ORAL | Status: DC | PRN

## 2015-05-22 MED ORDER — FUROSEMIDE 10 MG/ML IJ SOLN: 40.0000 mg | Freq: Three times a day (TID) | INTRAMUSCULAR | Status: DC

## 2015-05-22 MED ORDER — AZITHROMYCIN 500 MG IV SOLR
250.0000 mg | INTRAVENOUS | Status: DC
  Filled 2015-05-22 (×2): qty 250

## 2015-05-22 MED ORDER — ACETAMINOPHEN 325 MG PO TABS: 650.0000 mg | ORAL_TABLET | Freq: Four times a day (QID) | ORAL | Status: DC | PRN

## 2015-05-22 NOTE — Plan of Care (Addendum)
Problem: Discharge Progression Outcomes Goal: Complications resolved/controlled Outcome: Progressing Patient transferred from ICU. Made comfort care.  Family at the bedside. Spiritual care consult ordered. PRN meds for dyspnea and agitation ordered. Morphine given PRN for dyspnea Pt resting comfortably.

## 2015-05-22 NOTE — Progress Notes (Signed)
[facility] - [place] [facility]   PATIENT NAME: Michael Cisneros    MR#:  [id number]  DATE OF BIRTH:  Jan 16, 1929  SUBJECTIVE:  CHIEF COMPLAINT:   Chief Complaint  Patient presents with  . Shortness of Breath   Admitted for SOB. Had worsening SOB yesterday- and transferred to CCU with Bipap. drowsy today, again worsening of ABG- need Bipap.   REVIEW OF SYSTEMS:    ROS  UNobtainable due to encephalopathy  DRUG ALLERGIES:   Allergies  Allergen Reactions  . Amitriptyline Other (See Comments)    Reaction:  Unknown   . Amlodipine Other (See Comments)    Reaction:  Unknown   . Beta Adrenergic Blockers Other (See Comments)    Reaction:  Unknown   . Levofloxacin Other (See Comments)    Reaction:  Unknown   . Rosiglitazone Other (See Comments)    Reaction:  Unknown   . Simvastatin Other (See Comments)    Reaction:  Unknown     VITALS:  Blood pressure 127/55, pulse 83, temperature 98.9 F (37.2 C), temperature source Axillary, resp. rate 14, height 5\' 4"  (1.626 m), weight 90.1 kg (198 lb 10.2 oz), SpO2 97 %.  PHYSICAL EXAMINATION:   Physical Exam  GENERAL:  81 y.o.-year-old patient lying in the bed , restless EYES: Pupils unequal R>L HEENT: Head atraumatic, normocephalic. Oropharynx and nasopharynx clear.  NECK:  Supple, no jugular venous distention. No thyroid enlargement, no tenderness.  LUNGS: Decreased air entry bases with crackles and weezing CARDIOVASCULAR: S1, S2 normal. No murmurs, rubs, or gallops.  ABDOMEN: Soft, nontender, nondistended. Bowel sounds present. No organomegaly or mass.  EXTREMITIES: No cyanosis, clubbing or edema b/l.    NEUROLOGIC: Moves all 4 extremities to stimuli, overall drowsy. PSYCHIATRIC: The patient is drowzy and confused. SKIN: No obvious rash, lesion, or ulcer.    LABORATORY PANEL:   CBC  Recent Labs Lab 05/21/15 0554  WBC 6.1  HGB 11.0*  HCT 33.8*  PLT 101*    ------------------------------------------------------------------------------------------------------------------  Chemistries   Recent Labs Lab 05/21/15 0554 05/22/15 0313  NA 131* 132*  K 5.0 5.1  CL 91* 91*  CO2 34* 32  GLUCOSE 94 177*  BUN 34* 43*  CREATININE 2.09* 2.34*  CALCIUM 8.3* 8.3*  AST 41  --   ALT 26  --   ALKPHOS 124  --   BILITOT 0.3  --    ------------------------------------------------------------------------------------------------------------------  Cardiac Enzymes  Recent Labs Lab 2015-05-29 1958  TROPONINI <0.03   ------------------------------------------------------------------------------------------------------------------  RADIOLOGY:  Dg Chest 2 View  05/22/2015   CLINICAL DATA:  81 year old male with a history of shortness of breath and lower extremity swelling.  EXAM: CHEST - 2 VIEW  COMPARISON:  2015-05-29, 04/13/2015, 04/10/2015  FINDINGS: Cardiomediastinal silhouette likely unchanged, partially obscured by overlying lung/pleural disease. Calcifications of the aortic arch.  Low lung volumes.  Dense opacity at the left base, increased from the prior. Pleural parenchymal opacity on the left.  Dense opacity at the right base with meniscus, slightly increased from the prior.  Interlobular septal thickening with peribronchial cuffing.  No displaced fracture.  IMPRESSION: Worsening left greater than right pleural effusions, with ongoing evidence of CHF.  Atherosclerosis  Signed,  Yvone Neu. Loreta Ave, DO  [facility]  [facility]   Electronically Signed   By: Gilmer Mor D.O.   On: 05/22/2015 08:45   Dg Chest 2 View  May 29, 2015   CLINICAL DATA:  CHF and shortness of breath.  EXAM: CHEST - 2 VIEW  COMPARISON:  04/13/2015  FINDINGS: There is evidence of mild congestive heart failure and bilateral pleural effusions with a moderate left and small right effusion. Associated bibasilar atelectasis. The heart size is  stable.  IMPRESSION: Congestive heart failure with bilateral pleural effusions, left greater than right.   Electronically Signed   By: Irish Lack M.D.   On: 06/07/2015 18:00   Ct Head Wo Contrast  05/21/2015   CLINICAL DATA:  Acute altered mental status changes. Patient unresponsive 45 minutes ago.  EXAM: CT HEAD WITHOUT CONTRAST  TECHNIQUE: Contiguous axial images were obtained from the base of the skull through the vertex without intravenous contrast.  COMPARISON:  None.  FINDINGS: The ventricles are normal in size and configuration. No extra-axial fluid collections are identified. The gray-Serna differentiation is normal. No CT findings for acute intracranial process such as hemorrhage or infarction. No mass lesions. The brainstem and cerebellum are grossly normal.  The bony structures are intact. The paranasal sinuses and mastoid air cells are clear. The globes are intact.  IMPRESSION: No acute intracranial findings or mass lesion.   Electronically Signed   By: Rudie Meyer M.D.   On: 05/21/2015 10:46     ASSESSMENT AND PLAN:   53 m with HTN, CHF, DM, CKD3 here with SOB  # Acute encephalopathy Underlying dementia CT head negative, have respi failure- on bipap.  # Acute on chronic diastolic chf  lasix to  IV TID. Has pulm edema and b/l pleural effusions on CXR Monitor K and GFR. I/O Appreciated cardio and Pulm consult.  # Acute respiratory hypoxic failure secondary to acute exacerbation of CHF Sats 70% in ED on RA. Continue Bipap, pulmonary on case. Dr. Welton Flakes discussed with family about worsening respi status- family leaning towards DNR, need to follow ABG to check response.  # Acute COPD exacerbation steroids and Nebs. Add rocephin+ azithromycin.  # IDDM SSI, ADA  # hypertension Continue home medications ACE inhibitor, beta blocker and Lasix  # hypothyroidism Continue Synthroid  # CKD3 Monitor as he is on diuresis   Critically ill with respi failure and acute  encepahlopathy  All the records are reviewed and case discussed with Care Management/Social Workerr. Management plans discussed with the patient, family and they are in agreement.  CODE STATUS: FULL CODE  DVT Prophylaxis: SCDs  TOTAL CC TIME TAKING CARE OF THIS PATIENT: 40 minutes.    Altamese Dilling M.D on 05/22/2015 at 1:02 PM  Between 7am to 6pm - Pager - [phone]  After 6pm go to [email or web address] - password [email or web address] [facility]  [place] [facility]  Office  [phone]  CC: Primary care physician; BABAOFF, Lavada Mesi, MD

## 2015-05-22 NOTE — Progress Notes (Signed)
Follow up Note - Critical Care Medicine Note  Patient Details:    Michael Cisneros is an 81 y.o. male with a known history of insulin requiring diabetes mellitus, congestive heart failure, hypertension and COPD. Patient was admitted with increased shortness of breath and was noted to be significantly hypoxic on admission. His initial saturations noted in the ED were 77%, and he was started on oxygen therapy. Overnight had been off BIPAP this am his pH was 7.17  Lines/tubes : Urethral Catheter Broadus John. Katrinka Blazing, RN Non-latex (Active)  Indication for Insertion or Continuance of Catheter Unstable critical patients (first 24-48 hours);Aggressive IV diuresis 05/22/2015  7:46 AM  Site Assessment Clean;Intact 05/22/2015  7:46 AM  Catheter Maintenance Catheter secured;Drainage bag/tubing not touching floor 05/22/2015  7:46 AM  Collection Container Standard drainage bag 05/22/2015  7:46 AM  Securement Method Securing device (Describe) 05/22/2015  7:46 AM  Output (mL) 200 mL 05/21/2015 10:00 PM    Microbiology/Sepsis markers: No results found for this or any previous visit.  Anti-infectives:  Anti-infectives    None       Consults: Treatment Team:  Alwyn Pea, MD Yevonne Pax, MD    Studies: Dg Chest 2 View  05/22/2015   CLINICAL DATA:  81 year old male with a history of shortness of breath and lower extremity swelling.  EXAM: CHEST - 2 VIEW  COMPARISON:  05-28-2015, 04/13/2015, 04/10/2015  FINDINGS: Cardiomediastinal silhouette likely unchanged, partially obscured by overlying lung/pleural disease. Calcifications of the aortic arch.  Low lung volumes.  Dense opacity at the left base, increased from the prior. Pleural parenchymal opacity on the left.  Dense opacity at the right base with meniscus, slightly increased from the prior.  Interlobular septal thickening with peribronchial cuffing.  No displaced fracture.  IMPRESSION: Worsening left greater than right pleural effusions, with ongoing  evidence of CHF.  Atherosclerosis  Signed,  Yvone Neu. Loreta Ave, DO  [facility]  [facility]   Electronically Signed   By: Gilmer Mor D.O.   On: 05/22/2015 08:45   Dg Chest 2 View  05-28-2015   CLINICAL DATA:  CHF and shortness of breath.  EXAM: CHEST - 2 VIEW  COMPARISON:  04/13/2015  FINDINGS: There is evidence of mild congestive heart failure and bilateral pleural effusions with a moderate left and small right effusion. Associated bibasilar atelectasis. The heart size is stable.  IMPRESSION: Congestive heart failure with bilateral pleural effusions, left greater than right.   Electronically Signed   By: Irish Lack M.D.   On: 05/28/15 18:00   Ct Head Wo Contrast  05/21/2015   CLINICAL DATA:  Acute altered mental status changes. Patient unresponsive 45 minutes ago.  EXAM: CT HEAD WITHOUT CONTRAST  TECHNIQUE: Contiguous axial images were obtained from the base of the skull through the vertex without intravenous contrast.  COMPARISON:  None.  FINDINGS: The ventricles are normal in size and configuration. No extra-axial fluid collections are identified. The gray-Breighner differentiation is normal. No CT findings for acute intracranial process such as hemorrhage or infarction. No mass lesions. The brainstem and cerebellum are grossly normal.  The bony structures are intact. The paranasal sinuses and mastoid air cells are clear. The globes are intact.  IMPRESSION: No acute intracranial findings or mass lesion.   Electronically Signed   By: Rudie Meyer M.D.   On: 05/21/2015 10:46     Events:  Subjective:    Overnight Issues: was off BIPAP and this morning more somnolent. ABG  done and this shows a pH of 7.17  Objective:  Vital signs for last 24 hours: Temp:  [97.9 F (36.6 C)-98.9 F (37.2 C)] 98.9 F (37.2 C) (06/11 0746) Pulse Rate:  [79-96] 79 (06/11 1037) Resp:  [15-32] 19 (06/11 0947) BP: (101-220)/(43-193) 102/43 mmHg (06/11 0947) SpO2:   [91 %-100 %] 100 % (06/11 1037) FiO2 (%):  [25 %] 25 % (06/10 1600) Weight:  [90.1 kg (198 lb 10.2 oz)] 90.1 kg (198 lb 10.2 oz) (06/11 0514)  Hemodynamic parameters for last 24 hours:    Intake/Output from previous day: 06/10 0701 - 06/11 0700 In: 300 [P.O.:300] Out: 2475 [Urine:2475]  Intake/Output this shift:    Vent settings for last 24 hours: Vent Mode:  [-]  FiO2 (%):  [25 %] 25 %  Physical Exam:  Head atraumatic normocephalic Eyes PERRLA no incterus Throat: Respiratory CTA bilateral no ronchi rales noted Cardio RRR s1s2 normal no g/r/m GI soft non-tender no rebound Extremities no C/C/C no edema Neuro grossly normal tone normal strength  Results for orders placed or performed during the hospital encounter of 06/02/2015 (from the past 24 hour(s))  Blood gas, arterial     Status: Abnormal   Collection Time: 05/21/15 12:05 PM  Result Value Ref Range   FIO2 0.25 %   Delivery systems BILEVEL POSITIVE AIRWAY PRESSURE    Inspiratory PAP 16    Expiratory PAP 5    pH, Arterial 7.25 (L) 7.350 - 7.450   pCO2 arterial 77 (HH) 32.0 - 48.0 mmHg   pO2, Arterial 57 (L) 83.0 - 108.0 mmHg   Bicarbonate 33.8 (H) 21.0 - 28.0 mEq/L   Acid-Base Excess 4.0 (H) 0.0 - 3.0 mmol/L   O2 Saturation 84.1 %   Patient temperature 37.0    Collection site RIGHT RADIAL    Sample type ARTERIAL DRAW    Allens test (pass/fail) POSITIVE (A) PASS  Blood gas, arterial     Status: Abnormal   Collection Time: 05/21/15  4:38 PM  Result Value Ref Range   FIO2 0.25 %   Delivery systems BILEVEL POSITIVE AIRWAY PRESSURE    Inspiratory PAP 6    pH, Arterial 7.34 (L) 7.350 - 7.450   pCO2 arterial 62 (H) 32.0 - 48.0 mmHg   pO2, Arterial 54 (L) 83.0 - 108.0 mmHg   Bicarbonate 33.4 (H) 21.0 - 28.0 mEq/L   Acid-Base Excess 5.6 (H) 0.0 - 3.0 mmol/L   O2 Saturation 85.5 %   Patient temperature 37.0    Collection site RIGHT RADIAL    Sample type ARTERIAL DRAW    Allens test (pass/fail) POSITIVE (A) PASS   Glucose, capillary     Status: Abnormal   Collection Time: 05/21/15  5:59 PM  Result Value Ref Range   Glucose-Capillary 100 (H) 65 - 99 mg/dL  Glucose, capillary     Status: Abnormal   Collection Time: 05/21/15  9:34 PM  Result Value Ref Range   Glucose-Capillary 146 (H) 65 - 99 mg/dL   Comment 1 Notify RN   Basic metabolic panel     Status: Abnormal   Collection Time: 05/22/15  3:13 AM  Result Value Ref Range   Sodium 132 (L) 135 - 145 mmol/L   Potassium 5.1 3.5 - 5.1 mmol/L   Chloride 91 (L) 101 - 111 mmol/L   CO2 32 22 - 32 mmol/L   Glucose, Bld 177 (H) 65 - 99 mg/dL   BUN 43 (H) 6 - 20 mg/dL   Creatinine, Ser  2.34 (H) 0.61 - 1.24 mg/dL   Calcium 8.3 (L) 8.9 - 10.3 mg/dL   GFR calc non Af Amer 24 (L) >60 mL/min   GFR calc Af Amer 28 (L) >60 mL/min   Anion gap 9 5 - 15  Glucose, capillary     Status: Abnormal   Collection Time: 05/22/15  7:04 AM  Result Value Ref Range   Glucose-Capillary 154 (H) 65 - 99 mg/dL  Blood gas, arterial     Status: Abnormal (Preliminary result)   Collection Time: 05/22/15 10:20 AM  Result Value Ref Range   FIO2 0.28 %   Delivery systems NASAL CANNULA    pH, Arterial 7.17 (LL) 7.350 - 7.450   pCO2 arterial 105 (HH) 32.0 - 48.0 mmHg   pO2, Arterial 102 83.0 - 108.0 mmHg   Bicarbonate 38.3 (H) 21.0 - 28.0 mEq/L   Acid-Base Excess 5.9 (H) 0.0 - 3.0 mmol/L   O2 Saturation 96.1 %   Patient temperature 37.0    Collection site LEFT RADIAL    Drawn by [id number]    Sample type PENDING    Allens test (pass/fail) ARTERIAL DRAW (A) PASS  Glucose, capillary     Status: Abnormal   Collection Time: 05/22/15 11:06 AM  Result Value Ref Range   Glucose-Capillary 116 (H) 65 - 99 mg/dL     Assessment/Plan:   PULMONARY-Acute respiratory Failure with hypercania -will continue with BIPAP -discussed code status with family they are leaning towards DNR perhaps with comfort care -titrate fio2 and monitor ABG -also has pleural effusion noted but family is  going o decide regarding any further invasive procedures  CARDIO-Chronic Diastolic Heart failure -cardiology on board -diurese as tolerated  RENAL-Hyponatremia -monitor labs  ID -monitor for fevers  GI -GI prophylaxis  ENDO-Hyperglycemia -monitor FSBS -insulin coverage as needed -on solumedrol  NEURO-altered mental status -will monitor likely due to Hypercapnia    LOS: 2 days     Critical Care Total Time:  I have personally obtained a history, examined the patient, evaluated laboratory and imaging results, formulated the assessment and plan and placed orders.  The Patient requires high complexity decision making for assessment and support, frequent evaluation and titration of therapies, application of advanced monitoring technologies and extensive interpretation of multiple databases.    *Care during the described time interval was provided by me and/or other providers on the critical care team.  I have reviewed this patient's available data, including medical history, events of note, physical examination and test results as part of my evaluation.

## 2015-05-22 NOTE — Progress Notes (Signed)
Patient's blood pressure dropped to 89/43 while sleeping. I tried to awaken him and he is non-responsive. ABG drawn by RT and order received for bi-pap and patient now on bi-pap.

## 2015-05-22 NOTE — Progress Notes (Signed)
   05/22/15 1900  Clinical Encounter Type  Visited With Family  Visit Type Initial  Referral From Nurse  Consult/Referral To Chaplain  Spiritual Encounters  Spiritual Needs Emotional  Stress Factors  Patient Stress Factors None identified  Family Stress Factors None identified  Chaplin received consult from nurse and visited family in patients room as he rested with comfort. Chaplin offered support to family.  Tonna Boehringer Braymer [phone]

## 2015-05-22 NOTE — Progress Notes (Signed)
Dr. Welton Flakes notified of ABG results ph-7.17 CO2-105 PO2-102 and patient back on bi-pap. He asked if family wants him intubated and I told him daughter-in-law said she knows the mother will want everything done but the wife and son are on the way.

## 2015-05-22 NOTE — Progress Notes (Signed)
Paged MD 2x about clarification of end of life orders. Waiting on callback.

## 2015-05-22 NOTE — Progress Notes (Signed)
Patient family has decided to make him DNR and take BIPAP off

## 2015-05-22 NOTE — Progress Notes (Signed)
Subjective:    Respiratory failure weakness fatigue shortness of breath. Extreme shortness of breath requiring BiPAP.  Objective:  Vital Signs in the last 24 hours: Temp:  [97.9 F (36.6 C)-98.9 F (37.2 C)] 98.9 F (37.2 C) (06/11 1200) Pulse Rate:  [73-96] 87 (06/11 1334) Resp:  [14-32] 23 (06/11 1334) BP: (94-220)/(38-193) 114/47 mmHg (06/11 1300) SpO2:  [91 %-100 %] 99 % (06/11 1334) FiO2 (%):  [25 %] 25 % (06/10 1600) Weight:  [90.1 kg (198 lb 10.2 oz)] 90.1 kg (198 lb 10.2 oz) (06/11 0514)  Intake/Output from previous day: 06/10 0701 - 06/11 0700 In: 300 [P.O.:300] Out: 2475 [Urine:2475] Intake/Output from this shift: Total I/O In: 0  Out: 125 [Urine:125]  Physical Exam: General appearance: fatigued, moderate distress and slowed mentation Neck: no adenopathy, no carotid bruit, no JVD, supple, symmetrical, trachea midline and thyroid not enlarged, symmetric, no tenderness/mass/nodules Lungs: clear to auscultation bilaterally and normal percussion bilaterally Heart: regular rate and rhythm, S1, S2 normal, no murmur, click, rub or gallop and normal apical impulse Abdomen: soft, non-tender; bowel sounds normal; no masses,  no organomegaly Extremities: extremities normal, atraumatic, no cyanosis or edema Pulses: 2+ and symmetric Skin: Skin color, texture, turgor normal. No rashes or lesions Neurologic: Mental status: Alert, oriented, thought content appropriate, affect: inappropriate  Lab Results:  Recent Labs  05/22/2015 1650 05/21/15 0554  WBC 4.4 6.1  HGB 10.5* 11.0*  PLT 76* 101*    Recent Labs  05/21/15 0554 05/22/15 0313  NA 131* 132*  K 5.0 5.1  CL 91* 91*  CO2 34* 32  GLUCOSE 94 177*  BUN 34* 43*  CREATININE 2.09* 2.34*    Recent Labs  05/17/2015 1650 06/02/2015 1958  TROPONINI 0.03 <0.03   Hepatic Function Panel  Recent Labs  05/21/15 0554  PROT 6.2*  ALBUMIN 3.0*  AST 41  ALT 26  ALKPHOS 124  BILITOT 0.3   No results for input(s):  CHOL in the last 72 hours. No results for input(s): PROTIME in the last 72 hours.  Imaging: Imaging results have been reviewed  Cardiac Studies:  Assessment/Plan:  Atrial Fibrillation CHF Edema Palpitations Shortness of Breath   respiratory failure  shortness of breath  pleural effusion  bronchitis  diabetes  hyperlipidemia . PLAN  BiPAP for respiratory failure  consider intubation and  respiratory support  congestive heart failure  Therapy  diuretic therapy for heart failure  continue insulin for diabetes  DVT prophylaxis  inhalers for congestion  continue steroid therapy  continue blood pressure control  consider thoracentesis for pleural effusion to help with her story improvement  agree with Pulmonary input for  respiratory failure  continue Protonix therapy for reflux symptoms  agree with echocardiogram for evaluation of left ventricular function   LOS: 2 days    CALLWOOD,DWAYNE D. 05/22/2015, 2:03 PM

## 2015-05-23 MED ORDER — LORAZEPAM 2 MG/ML PO CONC: 1.0000 mg | ORAL | Status: DC | PRN

## 2015-05-23 MED ORDER — LORAZEPAM 2 MG/ML IJ SOLN
1.0000 mg | INTRAMUSCULAR | Status: DC | PRN
  Administered 2015-05-23: 1 mg via INTRAVENOUS
  Filled 2015-05-23: qty 1

## 2015-05-23 MED ORDER — LORAZEPAM 1 MG PO TABS: 1.0000 mg | ORAL_TABLET | ORAL | Status: DC | PRN

## 2015-05-24 LAB — BLOOD GAS, ARTERIAL
ACID-BASE EXCESS: 3.2 mmol/L — AB (ref 0.0–3.0)
ACID-BASE EXCESS: 5.9 mmol/L — AB (ref 0.0–3.0)
Acid-Base Excess: 4 mmol/L — ABNORMAL HIGH (ref 0.0–3.0)
Allens test (pass/fail): POSITIVE — AB
Allens test (pass/fail): POSITIVE — AB
BICARBONATE: 33.8 meq/L — AB (ref 21.0–28.0)
Bicarbonate: 34.4 mEq/L — ABNORMAL HIGH (ref 21.0–28.0)
Bicarbonate: 38.3 mEq/L — ABNORMAL HIGH (ref 21.0–28.0)
DELIVERY SYSTEMS: POSITIVE
DRAWN BY: 187461
Drawn by: 187461
Expiratory PAP: 5
FIO2: 0.28 %
FIO2: 0.25 %
FIO2: 0.28 %
INSPIRATORY PAP: 16
O2 Saturation: 82.3 %
O2 Saturation: 84.1 %
O2 Saturation: 96.1 %
PATIENT TEMPERATURE: 37
PCO2 ART: 90 mmHg — AB (ref 32.0–48.0)
PO2 ART: 102 mmHg (ref 83.0–108.0)
Patient temperature: 37
Patient temperature: 37
pCO2 arterial: 77 mmHg (ref 32.0–48.0)
pCO2 arterial: 105 mmHg (ref 32.0–48.0)
pH, Arterial: 7.19 — CL (ref 7.350–7.450)
pH, Arterial: 7.25 — ABNORMAL LOW (ref 7.350–7.450)
pH, Arterial: 7.17 — CL (ref 7.350–7.450)
pO2, Arterial: 58 mmHg — ABNORMAL LOW (ref 83.0–108.0)
pO2, Arterial: 57 mmHg — ABNORMAL LOW (ref 83.0–108.0)

## 2015-06-04 LAB — GLUCOSE, CAPILLARY: GLUCOSE-CAPILLARY: 118 mg/dL — AB (ref 65–99)

## 2015-06-11 NOTE — Progress Notes (Signed)
Notified Dr. Anne Hahn of pt's anxiety, orders received.

## 2015-06-11 NOTE — Progress Notes (Addendum)
Pt expired at 0300, noted no respirations, no pulse, no heartbeat, verified by Bernette Redbird, RN. MD notified, [facility] notified, [employer] notified, family at bedside. IV removed, foley removed, [employer] arrived at [phone].

## 2015-06-11 NOTE — Progress Notes (Signed)
Dr. Betti Cruz notified of patient expired at 0300.

## 2015-06-11 NOTE — Discharge Summary (Signed)
[facility] [place] at [facility]   PATIENT NAME: Michael Cisneros    MR#:  [id number]  DATE OF BIRTH:  December 20, 1928  DATE OF ADMISSION:  19-Jun-2015 ADMITTING PHYSICIAN: Michael Cisneros  DATE OF DISCHARGE/ death: 06-22-15   PRIMARY CARE PHYSICIAN: BABAOFF, MARC Cisneros, Cisneros    ADMISSION DIAGNOSIS:  Congestive heart failure, unspecified congestive heart failure chronicity, unspecified congestive heart failure type [I50.9]  DISCHARGE DIAGNOSIS:    Cause of Death- Congestive heart failure  SECONDARY DIAGNOSIS:   Past Medical History  Diagnosis Date  . Diabetes mellitus without complication   . Hypertension   . Anemia   . Cardiac insufficiency   . CHF (congestive heart failure)   . COPD (chronic obstructive pulmonary disease)     HOSPITAL COURSE:   # Acute encephalopathy Underlying dementia CT head negative, have respi failure- was maintained on bipap.  # Acute on chronic diastolic chf lasix to  IV TID. Had pulm edema and b/l pleural effusions on CXR Monitor K and GFR. I/O Appreciated cardio and Pulm consult.  as there was minimal improvement in opverall condition and worsening in Blood gas     Dr. Welton Cisneros discussed with family about further options- they chose comfort care- pt was transferred out of ICU- and he died next day under comfort measures.  # Acute respiratory hypoxic failure secondary to acute exacerbation of CHF Sats 70% in ED on RA. Continue Bipap, pulmonary on case. Dr. Welton Cisneros discussed with family about worsening respi status- family leaning towards DNR,finally chose comfort measures.  # Acute COPD exacerbation steroids and Nebs. Added rocephin+ azithromycin.  # IDDM SSI, ADA  # hypertension Continue home medications ACE inhibitor, beta blocker and Lasix  # hypothyroidism Continue Synthroid  # CKD3 Monitor as he is on diuresis  DISCHARGE CONDITIONS:   Pt died.  CONSULTS OBTAINED:  Treatment Team:  Michael Pea,  Cisneros  DRUG ALLERGIES:   Allergies  Allergen Reactions  . Amitriptyline Other (See Comments)    Reaction:  Unknown   . Amlodipine Other (See Comments)    Reaction:  Unknown   . Beta Adrenergic Blockers Other (See Comments)    Reaction:  Unknown   . Levofloxacin Other (See Comments)    Reaction:  Unknown   . Rosiglitazone Other (See Comments)    Reaction:  Unknown   . Simvastatin Other (See Comments)    Reaction:  Unknown     DISCHARGE MEDICATIONS:   None.  DISCHARGE INSTRUCTIONS:   None.   HISTORY OF PRESENT ILLNESS:  Michael Cisneros  is a 81 y.o. male with a known history of insulin requiring diabetes mellitus, congestive heart failure, hypertension and COPD is brought into the ED with a chief complaint of shortness of breath. Patient denies any chest pain but feeling nauseous. Also complaining of generalized weakness and lower extremity edema. Patient has reported that he has gained approximately 20 pounds over the past 4 weeks. In the ED his pulse ox was at 77% on room air. Patient was placed on 2 L of oxygen via nasal cannula and his pulse ox went up to 96%. Chest x-ray has revealed pulmonary congestion. Patient was given Lasix IV   DATA REVIEW:   CBC  Recent Labs Cisneros 05/21/15 0554  WBC 6.1  HGB 11.0*  HCT 33.8*  PLT 101*    Chemistries   Recent Labs Cisneros 05/21/15 0554 05/22/15 0313  NA 131* 132*  K 5.0 5.1  CL 91* 91*  CO2  34* 32  GLUCOSE 94 177*  BUN 34* 43*  CREATININE 2.09* 2.34*  CALCIUM 8.3* 8.3*  AST 41  --   ALT 26  --   ALKPHOS 124  --   BILITOT 0.3  --     Cardiac Enzymes  Recent Labs Cisneros Jun 11, 2015 1958  TROPONINI <0.03    Microbiology Results  Results for orders placed or performed during the hospital encounter of 2015/06/11  MRSA PCR Screening     Status: None   Collection Time: 05/22/15 10:34 AM  Result Value Ref Range Status   MRSA by PCR NEGATIVE NEGATIVE Final    Comment:        The GeneXpert MRSA Assay (FDA approved for  NASAL specimens only), is one component of a comprehensive MRSA colonization surveillance program. It is not intended to diagnose MRSA infection nor to guide or monitor treatment for MRSA infections.      Altamese Dilling M.D on 05/22/13 at 8:22 AM  Between 7am to 6pm - Pager - [phone]  After 6pm go to [email or web address] - password [email or web address] [facility]  [place] [facility]  Office  [phone]  CC: Primary care physician; BABAOFF, Lavada Mesi, Cisneros

## 2015-06-11 DEATH — deceased

## 2015-06-25 ENCOUNTER — Other Ambulatory Visit: Payer: TRICARE For Life (TFL)

## 2015-10-29 ENCOUNTER — Other Ambulatory Visit: Payer: TRICARE For Life (TFL)

## 2016-02-25 ENCOUNTER — Ambulatory Visit: Payer: TRICARE For Life (TFL)

## 2016-02-25 ENCOUNTER — Other Ambulatory Visit: Payer: TRICARE For Life (TFL)

## 2016-02-25 ENCOUNTER — Ambulatory Visit: Payer: TRICARE For Life (TFL) | Admitting: Internal Medicine

## 2016-04-19 IMAGING — CT CT HEAD W/O CM
2 series · 16 of 30 positions shown, 20 images · non-contrast
Comparison: None.

CLINICAL DATA: Acute altered mental status changes. Patient
unresponsive 45 minutes ago.

EXAM:
CT HEAD WITHOUT CONTRAST
TECHNIQUE: Contiguous axial images were obtained from the base of the skull
through the vertex without intravenous contrast.

[Series 5: head wo · axial · 0.43mm/px · z∈[+1207,+1344]mm · 13 of 34 slices shown, 17 images]
[im 3/34  brain]
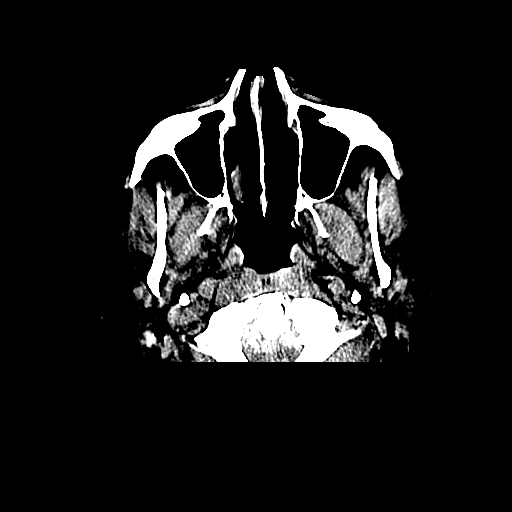
[im 3/34  bone]
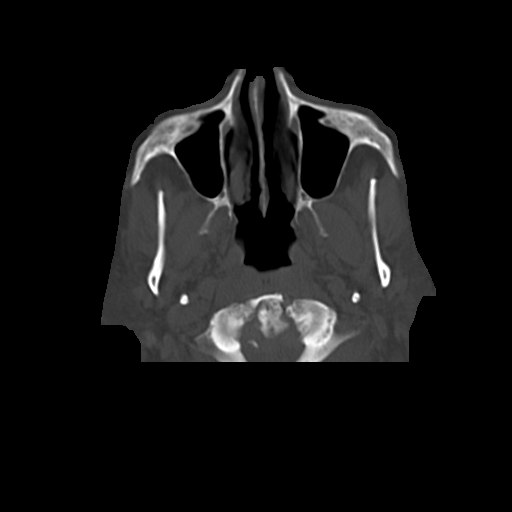
[im 5/34  brain]
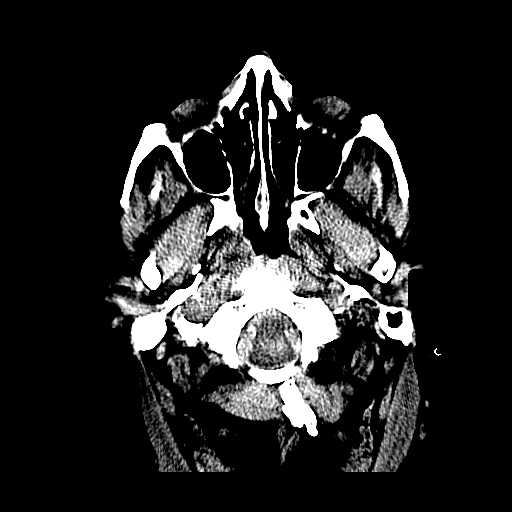
[im 8/34  brain]
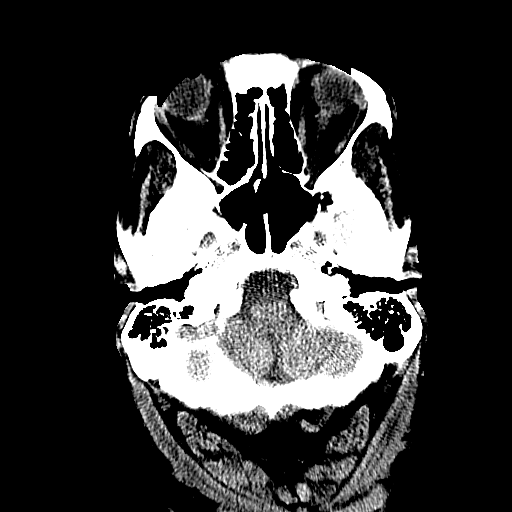
[im 10/34  brain]
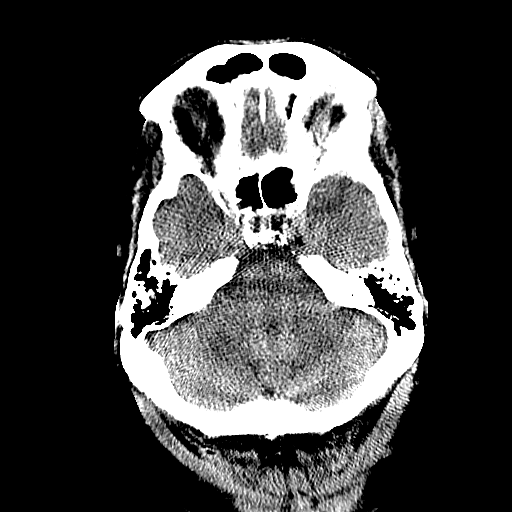
[im 12/34  brain]
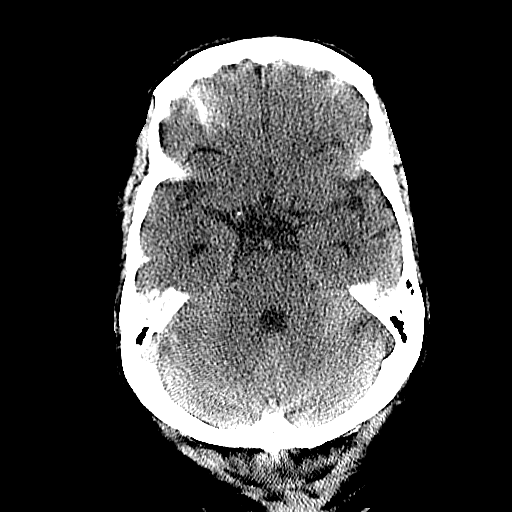
[im 12/34  bone]
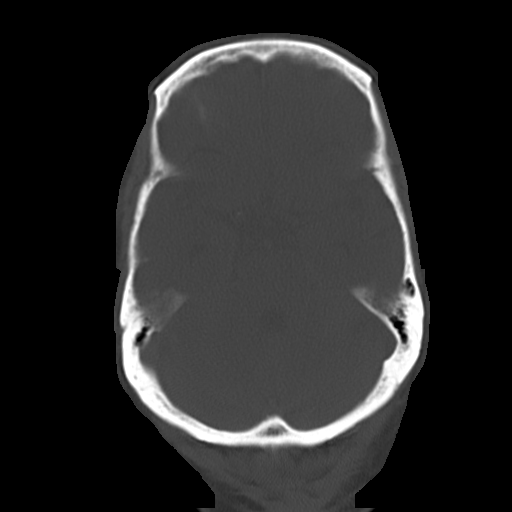
[im 15/34  brain]
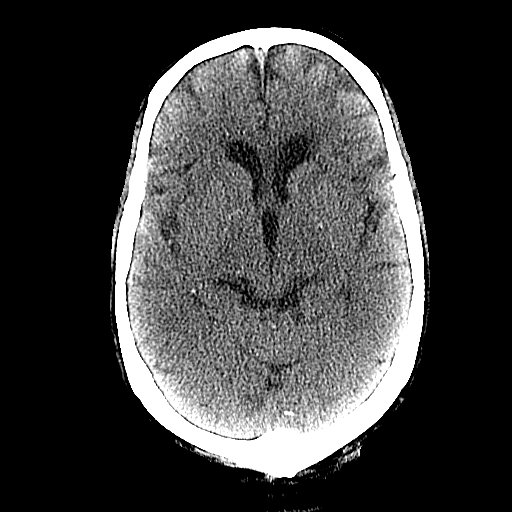
[im 17/34  brain]
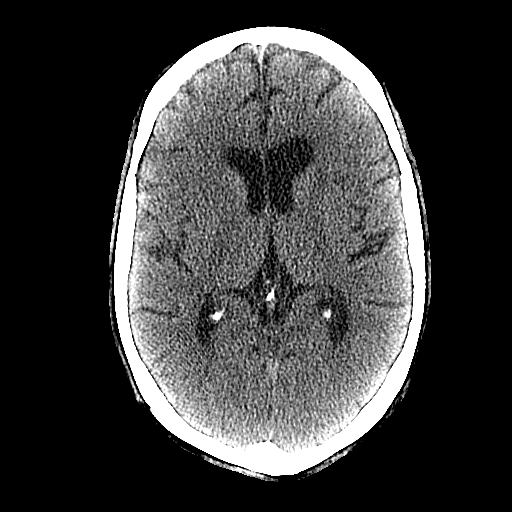
[im 19/34  brain]
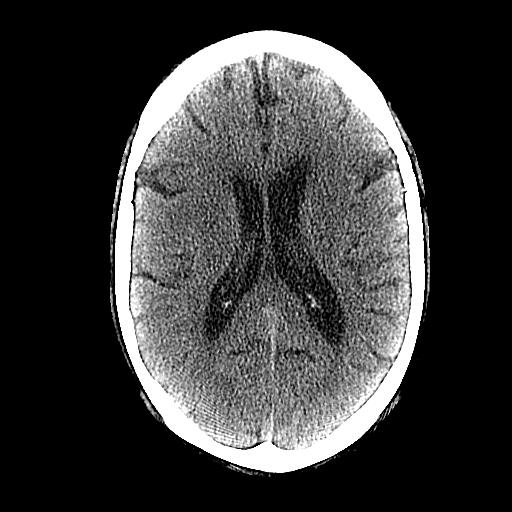
[im 22/34  brain]
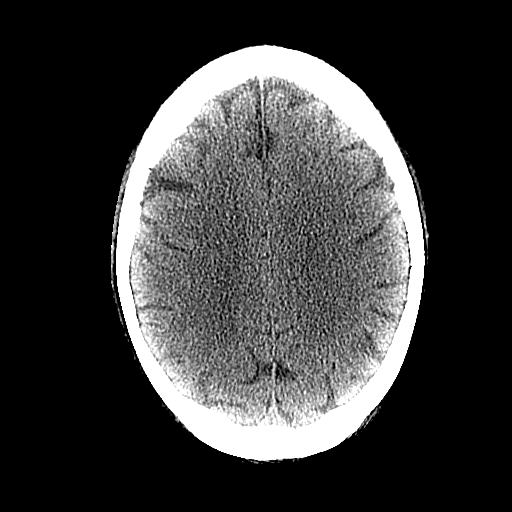
[im 22/34  bone]
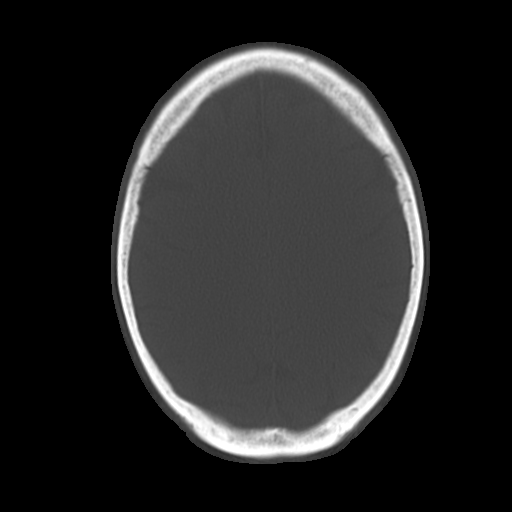
[im 24/34  brain]
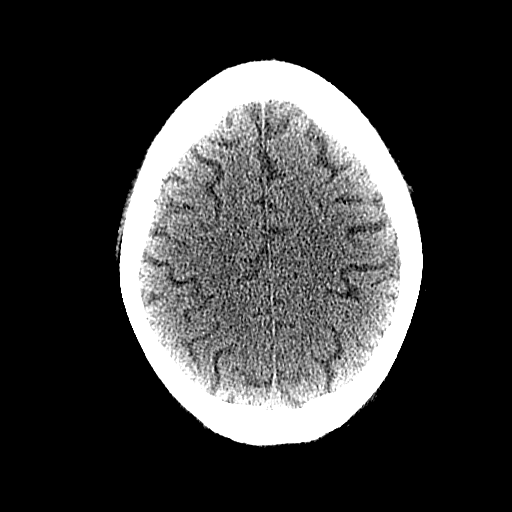
[im 26/34  brain]
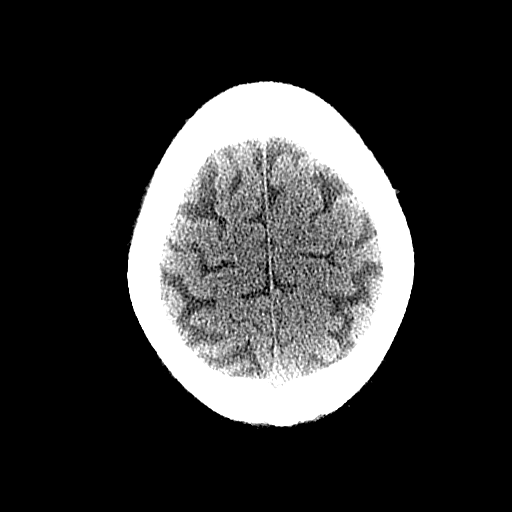
[im 29/34  brain]
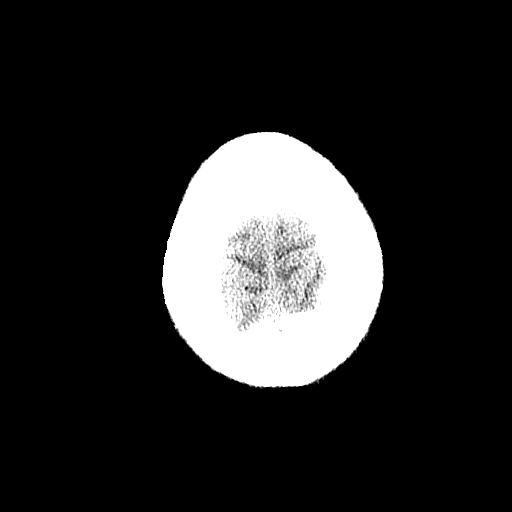
[im 31/34  brain]
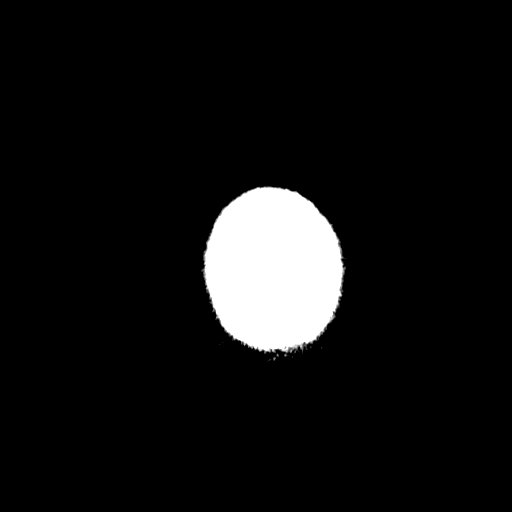
[im 31/34  bone]
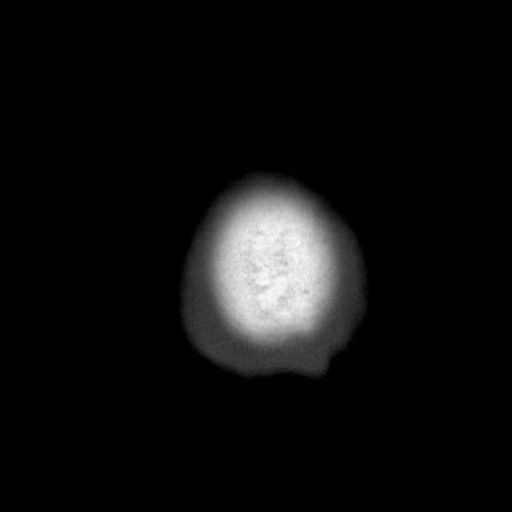

[Series 6: head wo-- · axial · 0.43mm/px · z∈[+1207,+1251]mm · 3 of 34 slices shown]
[im 3/34  brain]
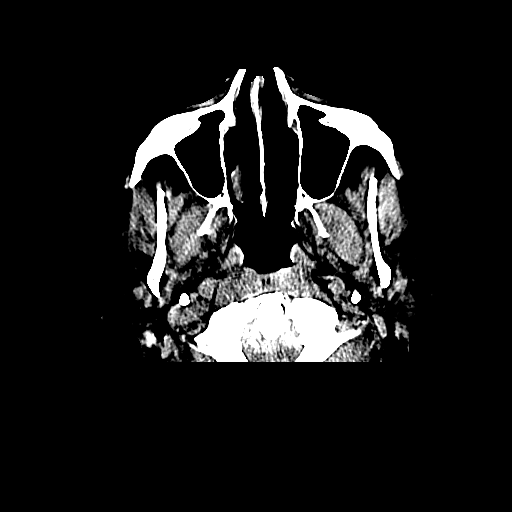
[im 8/34  brain]
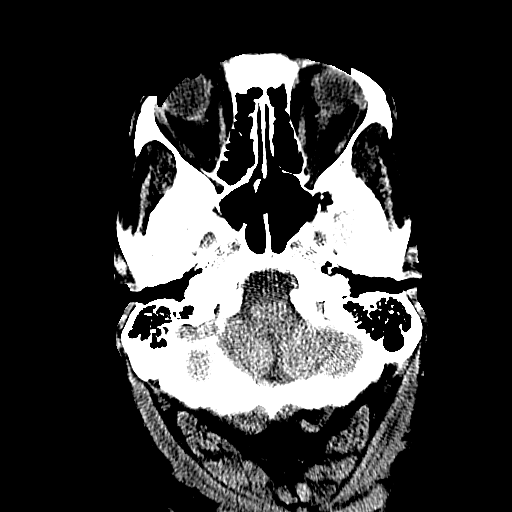
[im 12/34  brain]
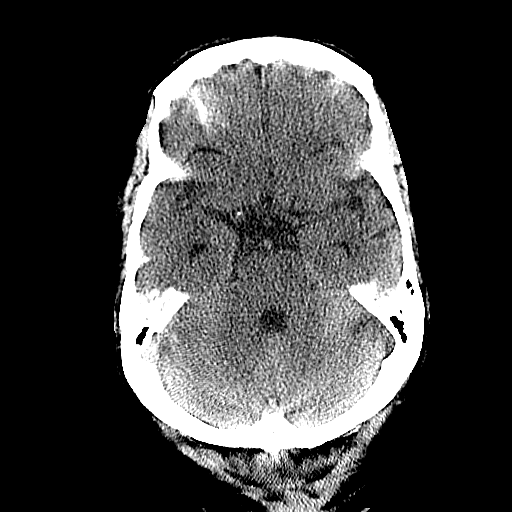

[16 of 30 positions shown; findings below may reference images not displayed]

FINDINGS: The ventricles are normal in size and configuration. No extra-axial
fluid collections are identified. The gray-white differentiation is
normal. No CT findings for acute intracranial process such as
hemorrhage or infarction. No mass lesions. The brainstem and
cerebellum are grossly normal.

The bony structures are intact. The paranasal sinuses and mastoid
air cells are clear. The globes are intact.
IMPRESSION: No acute intracranial findings or mass lesion.

## 2016-04-20 IMAGING — CR DG CHEST 2V
1 series · 2 of 2 positions shown · non-contrast
Comparison: 05/20/2015, 04/13/2015, 04/10/2015

CLINICAL DATA: 85-year-old male with a history of shortness of
breath and lower extremity swelling.

EXAM:
CHEST - 2 VIEW

[Series 1: dg chest 2 view · 0.14mm/px · 2 of 2 slices shown]
[im 1/2]
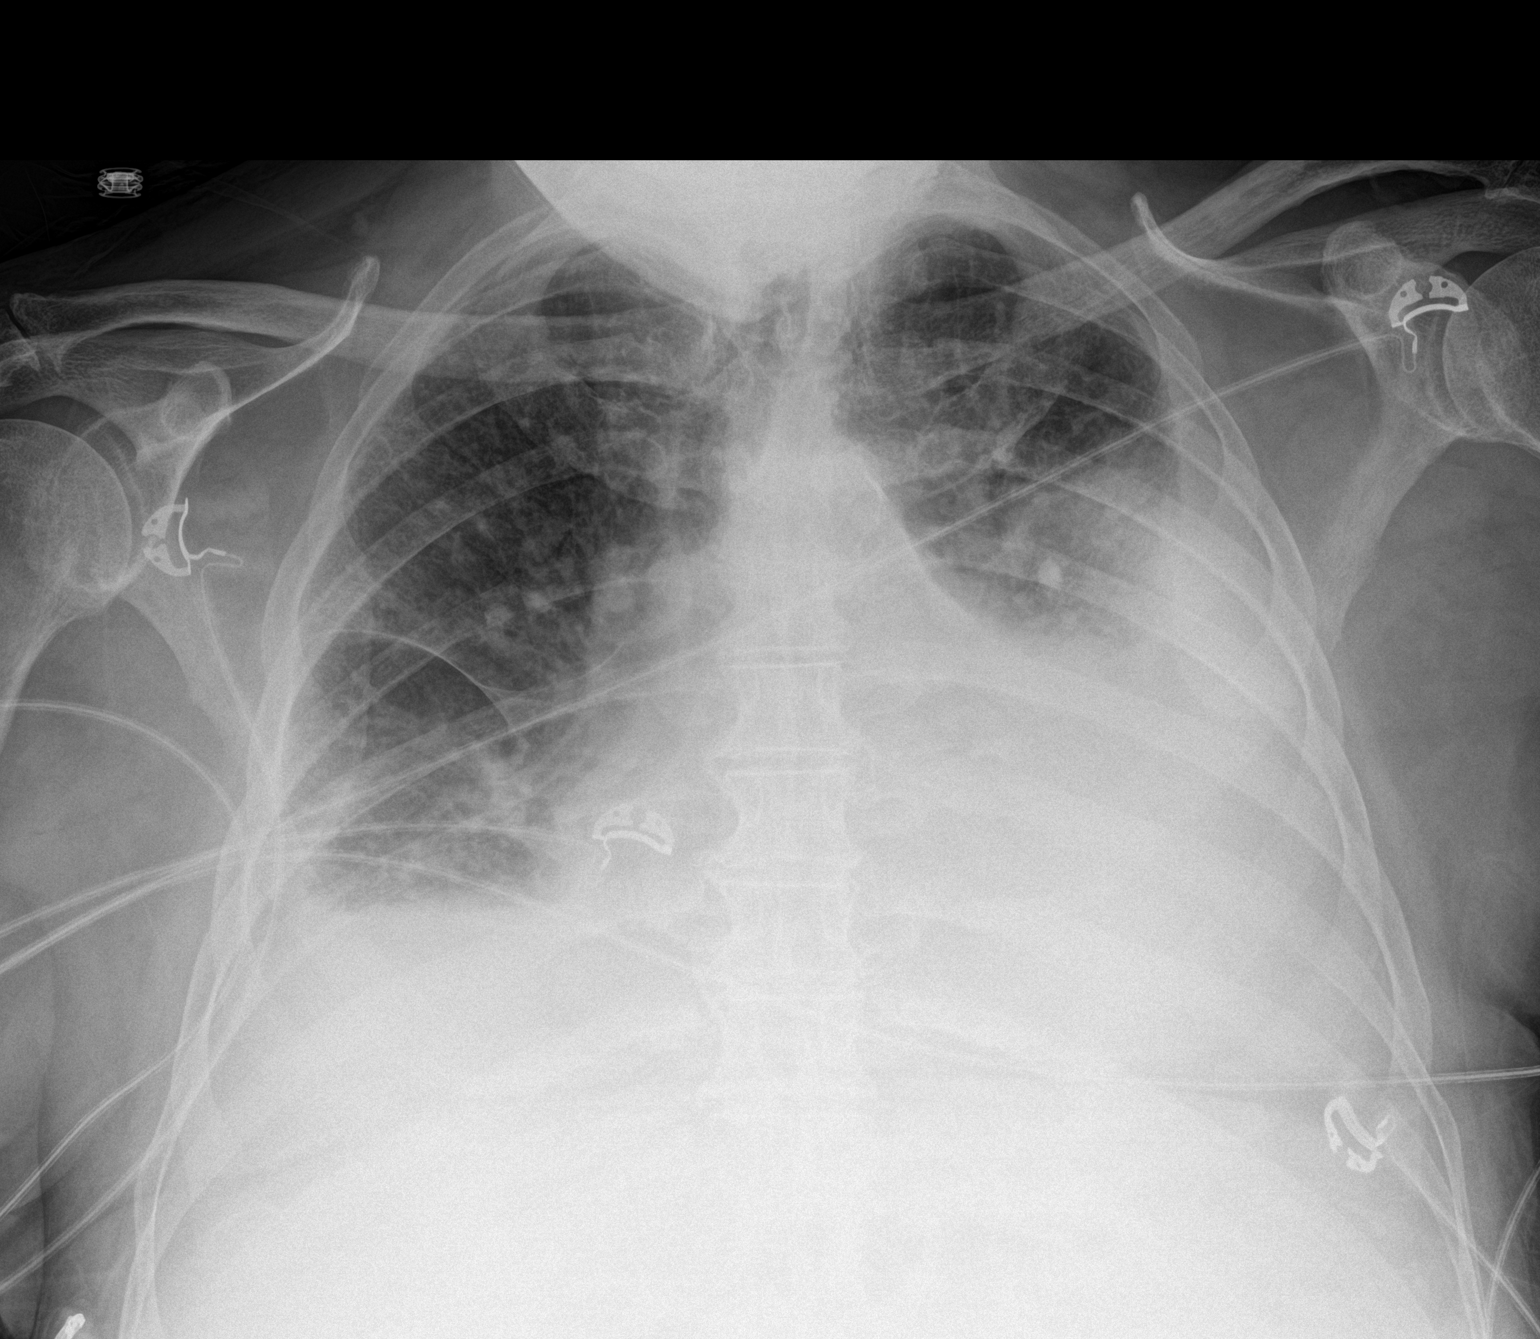
[im 2/2]
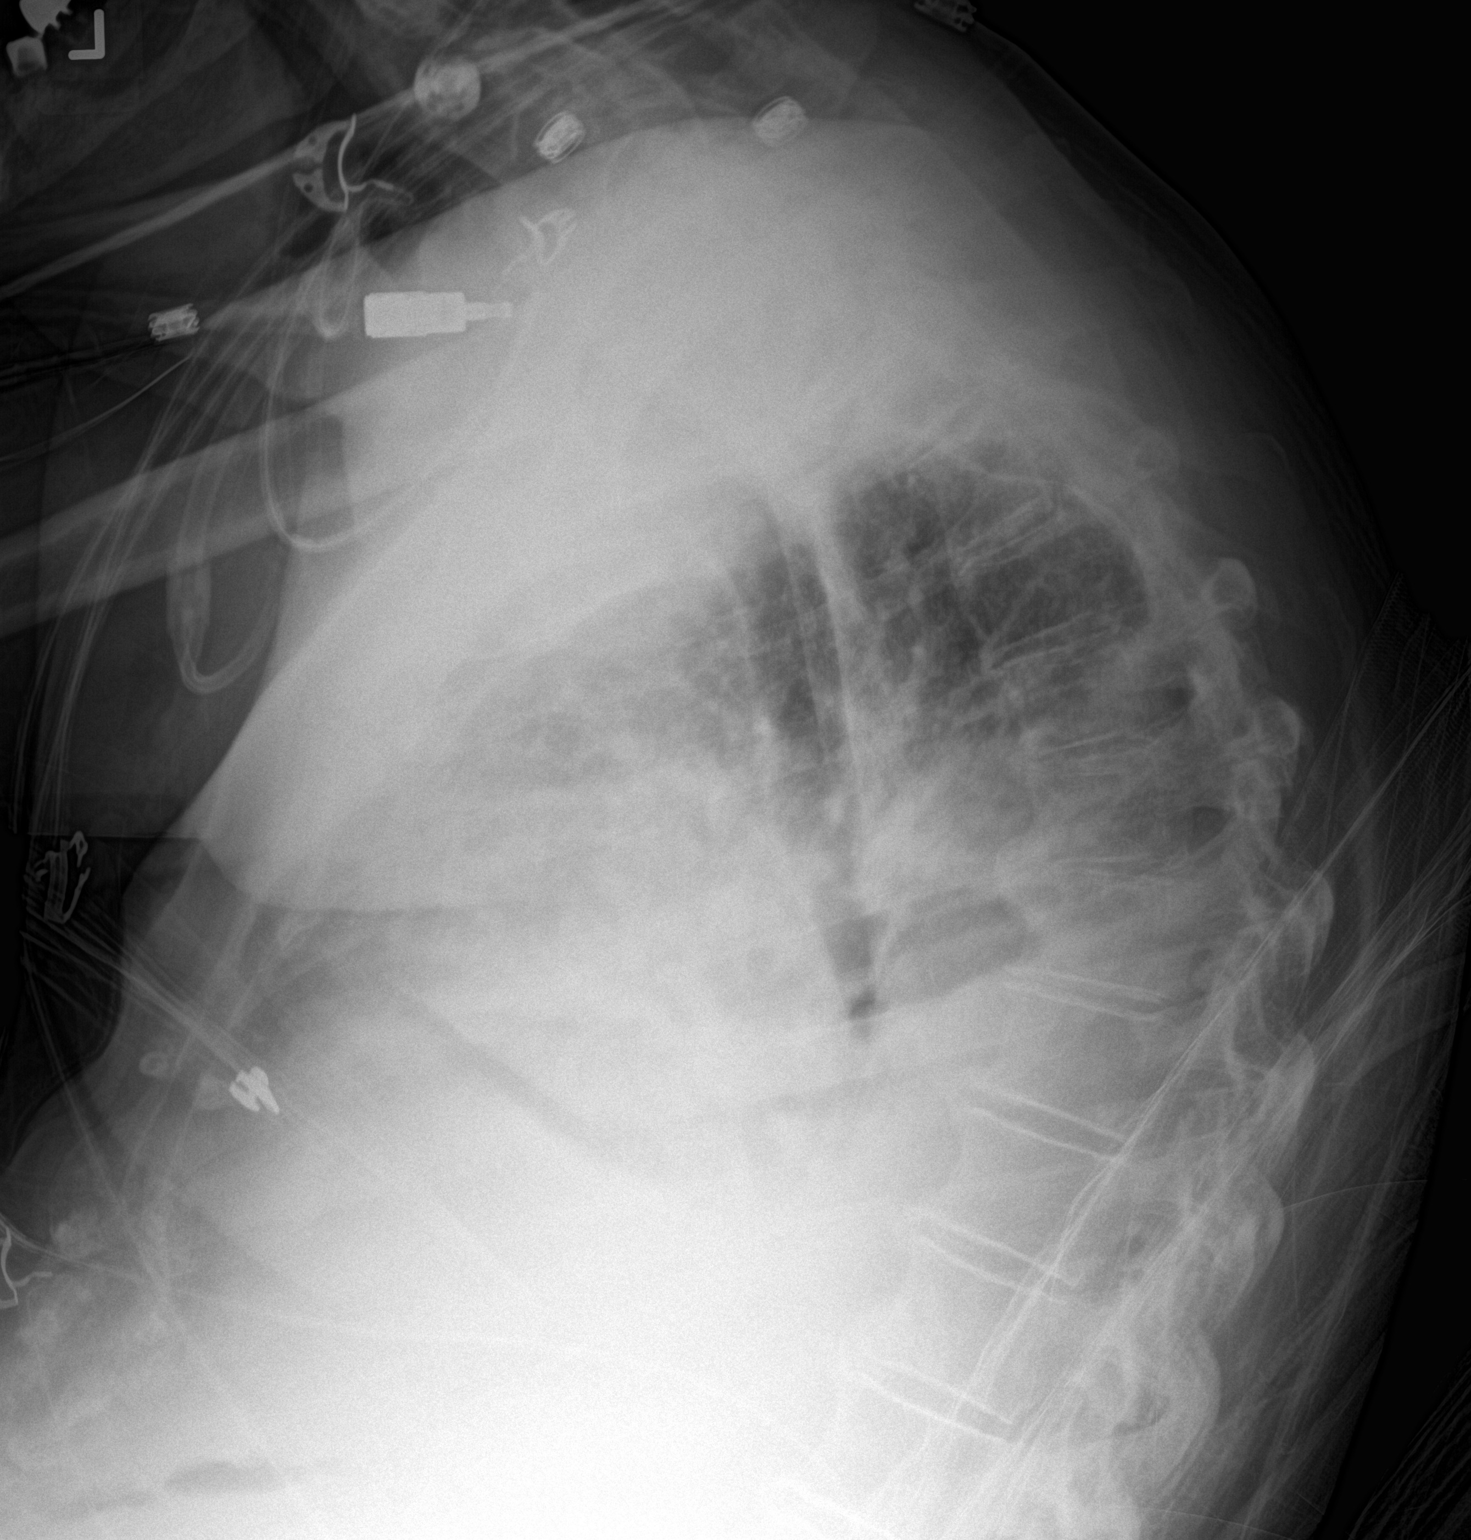

[2 of 2 positions shown; findings below may reference images not displayed]

FINDINGS: Cardiomediastinal silhouette likely unchanged, partially obscured by
overlying lung/pleural disease. Calcifications of the aortic arch.

Low lung volumes.

Dense opacity at the left base, increased from the prior. Pleural
parenchymal opacity on the left.

Dense opacity at the right base with meniscus, slightly increased
from the prior.

Interlobular septal thickening with peribronchial cuffing.

No displaced fracture.
IMPRESSION: Worsening left greater than right pleural effusions, with ongoing
evidence of CHF.

Atherosclerosis
